# Patient Record
Sex: Male | Born: 1965 | Race: White | Hispanic: No | Marital: Married | Smoking: Never smoker
Health system: Southern US, Community
[De-identification: ages and names within clinical notes are randomized; demographics above are authoritative.]

## PROBLEM LIST (undated history)

## (undated) DIAGNOSIS — I10 Essential (primary) hypertension: Secondary | ICD-10-CM

## (undated) DIAGNOSIS — N400 Enlarged prostate without lower urinary tract symptoms: Secondary | ICD-10-CM

## (undated) DIAGNOSIS — G459 Transient cerebral ischemic attack, unspecified: Secondary | ICD-10-CM

## (undated) HISTORY — PX: BONE TUMOR EXCISION: SHX1254

## (undated) HISTORY — PX: FRACTURE SURGERY: SHX138

---

## 2006-12-23 ENCOUNTER — Emergency Department: Payer: Self-pay | Admitting: Emergency Medicine

## 2009-10-30 DIAGNOSIS — R55 Syncope and collapse: Secondary | ICD-10-CM

## 2009-10-30 HISTORY — DX: Syncope and collapse: R55

## 2010-06-14 ENCOUNTER — Emergency Department: Payer: Self-pay | Admitting: Internal Medicine

## 2010-06-16 ENCOUNTER — Ambulatory Visit: Payer: Self-pay | Admitting: Orthopedic Surgery

## 2010-10-17 ENCOUNTER — Observation Stay: Payer: Self-pay | Admitting: Internal Medicine

## 2018-04-29 ENCOUNTER — Other Ambulatory Visit: Payer: Self-pay

## 2018-04-29 ENCOUNTER — Emergency Department: Payer: BLUE CROSS/BLUE SHIELD

## 2018-04-29 ENCOUNTER — Observation Stay
Admission: EM | Admit: 2018-04-29 | Discharge: 2018-05-01 | Disposition: A | Discharge status: Home / Self Care | Payer: BLUE CROSS/BLUE SHIELD | Attending: Internal Medicine | Admitting: Internal Medicine

## 2018-04-29 ENCOUNTER — Encounter: Payer: Self-pay | Admitting: Emergency Medicine

## 2018-04-29 DIAGNOSIS — R4781 Slurred speech: Secondary | ICD-10-CM

## 2018-04-29 DIAGNOSIS — F1010 Alcohol abuse, uncomplicated: Secondary | ICD-10-CM | POA: Insufficient documentation

## 2018-04-29 DIAGNOSIS — G459 Transient cerebral ischemic attack, unspecified: Secondary | ICD-10-CM | POA: Diagnosis not present

## 2018-04-29 DIAGNOSIS — R9089 Other abnormal findings on diagnostic imaging of central nervous system: Secondary | ICD-10-CM | POA: Insufficient documentation

## 2018-04-29 DIAGNOSIS — R197 Diarrhea, unspecified: Secondary | ICD-10-CM | POA: Insufficient documentation

## 2018-04-29 DIAGNOSIS — R42 Dizziness and giddiness: Principal | ICD-10-CM | POA: Insufficient documentation

## 2018-04-29 DIAGNOSIS — Z79899 Other long term (current) drug therapy: Secondary | ICD-10-CM | POA: Diagnosis not present

## 2018-04-29 DIAGNOSIS — E860 Dehydration: Secondary | ICD-10-CM | POA: Insufficient documentation

## 2018-04-29 DIAGNOSIS — I739 Peripheral vascular disease, unspecified: Secondary | ICD-10-CM | POA: Diagnosis not present

## 2018-04-29 DIAGNOSIS — I10 Essential (primary) hypertension: Secondary | ICD-10-CM | POA: Diagnosis not present

## 2018-04-29 HISTORY — DX: Essential (primary) hypertension: I10

## 2018-04-29 LAB — BASIC METABOLIC PANEL
ANION GAP: 12 (ref 5–15)
BUN: 15 mg/dL (ref 6–20)
CO2: 26 mmol/L (ref 22–32)
Calcium: 9.4 mg/dL (ref 8.9–10.3)
Chloride: 103 mmol/L (ref 98–111)
Creatinine, Ser: 0.73 mg/dL (ref 0.61–1.24)
GFR calc Af Amer: >60 mL/min (ref >60)
GLUCOSE: 194 mg/dL — AB (ref 70–99)
POTASSIUM: 3.6 mmol/L (ref 3.5–5.1)
SODIUM: 141 mmol/L (ref 135–145)

## 2018-04-29 LAB — URINALYSIS, COMPLETE (UACMP) WITH MICROSCOPIC
BACTERIA UA: NONE SEEN
Bilirubin Urine: NEGATIVE
GLUCOSE, UA: NEGATIVE mg/dL
Hgb urine dipstick: NEGATIVE
KETONES UR: NEGATIVE mg/dL
LEUKOCYTES UA: NEGATIVE
NITRITE: NEGATIVE
PH: 5 (ref 5.0–8.0)
Protein, ur: NEGATIVE mg/dL
Specific Gravity, Urine: 1.023 (ref 1.005–1.030)
Squamous Epithelial / LPF: NONE SEEN (ref 0–5)
WBC, UA: NONE SEEN WBC/hpf (ref 0–5)

## 2018-04-29 LAB — PROTIME-INR
INR: 1
PROTHROMBIN TIME: 13.1 s (ref 11.4–15.2)

## 2018-04-29 LAB — URINE DRUG SCREEN, QUALITATIVE (ARMC ONLY)
AMPHETAMINES, UR SCREEN: NOT DETECTED
Benzodiazepine, Ur Scrn: NOT DETECTED
Cannabinoid 50 Ng, Ur ~~LOC~~: NOT DETECTED
Cocaine Metabolite,Ur ~~LOC~~: NOT DETECTED
MDMA (ECSTASY) UR SCREEN: NOT DETECTED
METHADONE SCREEN, URINE: NOT DETECTED
OPIATE, UR SCREEN: NOT DETECTED
PHENCYCLIDINE (PCP) UR S: NOT DETECTED
Tricyclic, Ur Screen: NOT DETECTED

## 2018-04-29 LAB — GLUCOSE, CAPILLARY: Glucose-Capillary: 181 mg/dL — ABNORMAL HIGH (ref 70–99)

## 2018-04-29 LAB — CBC
HEMATOCRIT: 39.2 % — AB (ref 40.0–52.0)
Hemoglobin: 14 g/dL (ref 13.0–18.0)
MCH: 33.6 pg (ref 26.0–34.0)
MCHC: 35.7 g/dL (ref 32.0–36.0)
MCV: 94 fL (ref 80.0–100.0)
Platelets: 304 10*3/uL (ref 150–440)
RBC: 4.17 MIL/uL — ABNORMAL LOW (ref 4.40–5.90)
RDW: 12.7 % (ref 11.5–14.5)
WBC: 13.4 10*3/uL — AB (ref 3.8–10.6)

## 2018-04-29 LAB — TROPONIN I

## 2018-04-29 LAB — APTT

## 2018-04-29 MED ORDER — ASPIRIN 325 MG PO TABS
325.0000 mg | ORAL_TABLET | Freq: Every day | ORAL | Status: DC
  Administered 2018-04-29 – 2018-05-01 (×3): 325 mg via ORAL
  Filled 2018-04-29 (×3): qty 1

## 2018-04-29 MED ORDER — VITAMIN B-1 100 MG PO TABS
100.0000 mg | ORAL_TABLET | Freq: Every day | ORAL | Status: DC
  Administered 2018-04-30 – 2018-05-01 (×2): 100 mg via ORAL
  Filled 2018-04-29 (×2): qty 1

## 2018-04-29 MED ORDER — ACETAMINOPHEN 650 MG RE SUPP: 650.0000 mg | RECTAL | Status: DC | PRN

## 2018-04-29 MED ORDER — LISINOPRIL 20 MG PO TABS
20.0000 mg | ORAL_TABLET | Freq: Every day | ORAL | Status: DC
  Administered 2018-04-29 – 2018-05-01 (×3): 20 mg via ORAL
  Filled 2018-04-29 (×2): qty 1
  Filled 2018-04-29: qty 2

## 2018-04-29 MED ORDER — HYDROXYZINE HCL 25 MG PO TABS
25.0000 mg | ORAL_TABLET | Freq: Four times a day (QID) | ORAL | Status: DC | PRN
  Filled 2018-04-29: qty 1

## 2018-04-29 MED ORDER — ASPIRIN 300 MG RE SUPP: 300.0000 mg | Freq: Every day | RECTAL | Status: DC

## 2018-04-29 MED ORDER — THIAMINE HCL 100 MG/ML IJ SOLN
100.0000 mg | Freq: Once | INTRAMUSCULAR | Status: AC
  Administered 2018-04-29: 23:00:00 100 mg via INTRAMUSCULAR
  Filled 2018-04-29: qty 1

## 2018-04-29 MED ORDER — SODIUM CHLORIDE 0.9 % IV BOLUS
1000.0000 mL | Freq: Once | INTRAVENOUS | Status: AC
  Administered 2018-04-29: 1000 mL via INTRAVENOUS

## 2018-04-29 MED ORDER — LABETALOL HCL 5 MG/ML IV SOLN
10.0000 mg | INTRAVENOUS | Status: DC | PRN
  Administered 2018-04-29: 21:00:00 10 mg via INTRAVENOUS
  Filled 2018-04-29: qty 4

## 2018-04-29 MED ORDER — ACETAMINOPHEN 325 MG PO TABS
650.0000 mg | ORAL_TABLET | ORAL | Status: DC | PRN
  Administered 2018-04-30 (×2): 650 mg via ORAL
  Filled 2018-04-29 (×2): qty 2

## 2018-04-29 MED ORDER — LABETALOL HCL 5 MG/ML IV SOLN: 10.0000 mg | INTRAVENOUS | Status: DC | PRN

## 2018-04-29 MED ORDER — LORAZEPAM 1 MG PO TABS: 1.0000 mg | ORAL_TABLET | Freq: Four times a day (QID) | ORAL | Status: DC | PRN

## 2018-04-29 MED ORDER — ENOXAPARIN SODIUM 40 MG/0.4ML ~~LOC~~ SOLN: 40.0000 mg | SUBCUTANEOUS | Status: DC

## 2018-04-29 MED ORDER — ASPIRIN 81 MG PO CHEW: 324.0000 mg | CHEWABLE_TABLET | Freq: Once | ORAL | Status: DC

## 2018-04-29 MED ORDER — STROKE: EARLY STAGES OF RECOVERY BOOK: Freq: Once | Status: DC

## 2018-04-29 MED ORDER — ADULT MULTIVITAMIN W/MINERALS CH
1.0000 | ORAL_TABLET | Freq: Every day | ORAL | Status: DC
  Administered 2018-04-29 – 2018-05-01 (×3): 1 via ORAL
  Filled 2018-04-29 (×3): qty 1

## 2018-04-29 MED ORDER — ONDANSETRON 4 MG PO TBDP
4.0000 mg | ORAL_TABLET | Freq: Four times a day (QID) | ORAL | Status: DC | PRN
Start: 2018-04-29 — End: 2018-05-01
  Administered 2018-04-29 – 2018-04-30 (×2): 4 mg via ORAL
  Filled 2018-04-29 (×3): qty 1

## 2018-04-29 MED ORDER — TAMSULOSIN HCL 0.4 MG PO CAPS: 0.4000 mg | ORAL_CAPSULE | Freq: Every day | ORAL | Status: DC | PRN

## 2018-04-29 MED ORDER — LOPERAMIDE HCL 2 MG PO CAPS: 2.0000 mg | ORAL_CAPSULE | ORAL | Status: DC | PRN

## 2018-04-29 MED ORDER — ACETAMINOPHEN 160 MG/5ML PO SOLN
650.0000 mg | ORAL | Status: DC | PRN
  Filled 2018-04-29: qty 20.3

## 2018-04-29 NOTE — ED Triage Notes (Signed)
Patient presents to ED via POV from work with dizziness and nausea. Patient works outside. Patient reports eating today. Patient is pale and diaphoretic. Patient denies CP but does report mild shortness of breath.

## 2018-04-29 NOTE — ED Notes (Signed)
Pt unable to urinate at this time. Pt was given specimen cup for when is able to void. Pt wheeled back out to the lobby.

## 2018-04-29 NOTE — Progress Notes (Signed)
Advanced care plan. Purpose of the Encounter: CODE STATUS Parties in Attendance:Patient Patient's Decision Capacity:Good Subjective/Patient's story: Presented for dizziness and slurred speech Objective/Medical story Will need work up for cva Goals of care determination:  Advance care directives and goals of care discussed Patient wants everything done which includes cpr and intubation if need arises. CODE STATUS: Full code Time spent discussing advanced care planning: 16 minutes

## 2018-04-29 NOTE — Code Documentation (Signed)
Pt arrives with complaints of nausea and dizziness and abd pain, states he was working outside with a [employer] company with symptoms developed, pt was initially triaged and protocoled in lobby, at 1358 first nurse brought pt a blanket when he complained of right hand numbness, 1st nurse spoke to ED and received orders, at 1412 when Dr. Roxan Hockeyobinson assessed pt Code stroke was initiated, NIHSS 0, at 1453 during neuro exam pt states numbness has resolved but his stomach still hurts, no tPA due to resolved symptoms, report off to [employer] Group[employer]ica RN

## 2018-04-29 NOTE — Progress Notes (Signed)
   04/29/18 1437  Clinical Encounter Type  Visited With Patient;Family  Visit Type Initial;ED   Chaplain paged for Code Stroke, arrived in ED to find door to room closed and 2 family members in hallway.  Introduced self to patient's wife and aunt and provided pastoral presence and emotional support while they waited in the hallway as staff was evaluating the patient.  When staff was finished and family allowed back into the room, Chaplain introduced self to patient.  Family wanted time together, so Chaplain ended the visit.  Instructed patient and family to ask for chaplain if needed.

## 2018-04-29 NOTE — H&P (Signed)
[facility] [place] [facility]   PATIENT NAME: Tony Lang    MR#:  [id number]  DATE OF BIRTH:  [birth year]  DATE OF ADMISSION:  04/29/2018  PRIMARY CARE PHYSICIAN: Barbette Reichmann, MD   REQUESTING/REFERRING PHYSICIAN:   CHIEF COMPLAINT:   Chief Complaint  Patient presents with  . Dizziness    HISTORY OF PRESENT ILLNESS: Tony Lang  is a 52 y.o. male with a known history of dizziness and slurred speech patient works for [employer] and was working in the hot sun today.Marland Kitchen He felt weak and tired and dizzy.  After coming to the emergency room he was sitting in the lobby and he had some slurred speech and weakness in the right arm was evaluated immediately in the ER with CT head, MRI and MRA brain which did not show any abnormality. Patients slurred speech improved and weakness in the right arm is also improved in the emergency room.  His blood pressure has been elevated.  Hospitalist service was consulted.  PAST MEDICAL HISTORY:   Past Medical History:  Diagnosis Date  . Hypertension     PAST SURGICAL HISTORY: History reviewed. No pertinent surgical history.  SOCIAL HISTORY:  Social History   Tobacco Use  . Smoking status: Never Smoker  . Smokeless tobacco: Never Used  Substance Use Topics  . Alcohol use: Yes    Comment: 3-4 (12oz) beers daily     FAMILY HISTORY: No family history on file.  DRUG ALLERGIES: No Known Allergies  REVIEW OF SYSTEMS:   CONSTITUTIONAL: No fever, has fatigue and weakness.  EYES: No blurred or double vision.  EARS, NOSE, AND THROAT: No tinnitus or ear pain.  RESPIRATORY: No cough, shortness of breath, wheezing or hemoptysis.  CARDIOVASCULAR: No chest pain, orthopnea, edema.  GASTROINTESTINAL: No nausea, vomiting, diarrhea or abdominal pain.  GENITOURINARY: No dysuria, hematuria.  ENDOCRINE: No polyuria, nocturia,  HEMATOLOGY: No anemia, easy bruising or bleeding SKIN: No rash or  lesion. MUSCULOSKELETAL: No joint pain or arthritis.   NEUROLOGIC: No tingling, numbness,  Had weakness right arm dizziness PSYCHIATRY: No anxiety or depression.   MEDICATIONS AT HOME:  Prior to Admission medications   Medication Sig Start Date End Date Taking? Authorizing Provider  lisinopril (PRINIVIL,ZESTRIL) 20 MG tablet Take 10-20 mg by mouth daily.   Yes [provider]  tamsulosin (FLOMAX) 0.4 MG CAPS capsule Take 0.4 mg by mouth daily as needed (prostate).   Yes [provider]  traMADol (ULTRAM) 50 MG tablet Take 25 mg by mouth daily as needed for moderate pain.   Yes [provider]      PHYSICAL EXAMINATION:   VITAL SIGNS: Blood pressure (!) 170/107, pulse 83, temperature 97.8 F (36.6 C), temperature source Oral, resp. rate 15, height 5\' 11"  (1.803 m), weight 74.8 kg (165 lb), SpO2 100 %.  GENERAL:  52 y.o.-year-old patient lying in the bed with no acute distress.  EYES: Pupils equal, round, reactive to light and accommodation. No scleral icterus. Extraocular muscles intact.  HEENT: Head atraumatic, normocephalic. Oropharynx and nasopharynx clear.  NECK:  Supple, no jugular venous distention. No thyroid enlargement, no tenderness.  LUNGS: Normal breath sounds bilaterally, no wheezing, rales,rhonchi or crepitation. No use of accessory muscles of respiration.  CARDIOVASCULAR: S1, S2 normal. No murmurs, rubs, or gallops.  ABDOMEN: Soft, nontender, nondistended. Bowel sounds present. No organomegaly or mass.  EXTREMITIES: No pedal edema, cyanosis, or clubbing.  NEUROLOGIC: Cranial nerves II through XII are intact. Muscle strength 5/5  in all extremities. Sensation intact. Gait not checked.  Had slurred speech PSYCHIATRIC: The patient is alert and oriented x 3.  SKIN: No obvious rash, lesion, or ulcer.   LABORATORY PANEL:   CBC Recent Labs  Lab 04/29/18 1304  WBC 13.4*  HGB 14.0  HCT 39.2*  PLT 304  MCV 94.0  MCH 33.6  MCHC 35.7  RDW  12.7   ------------------------------------------------------------------------------------------------------------------  Chemistries  Recent Labs  Lab 04/29/18 1304  NA 141  K 3.6  CL 103  CO2 26  GLUCOSE 194*  BUN 15  CREATININE 0.73  CALCIUM 9.4   ------------------------------------------------------------------------------------------------------------------ estimated creatinine clearance is 115.6 mL/min (by C-G formula based on SCr of 0.73 mg/dL). ------------------------------------------------------------------------------------------------------------------ No results for input(s): TSH, T4TOTAL, T3FREE, THYROIDAB in the last 72 hours.  Invalid input(s): FREET3   Coagulation profile Recent Labs  Lab 04/29/18 1427  INR 1.00   ------------------------------------------------------------------------------------------------------------------- No results for input(s): DDIMER in the last 72 hours. -------------------------------------------------------------------------------------------------------------------  Cardiac Enzymes Recent Labs  Lab 04/29/18 1427  TROPONINI <0.03   ------------------------------------------------------------------------------------------------------------------ Invalid input(s): POCBNP  ---------------------------------------------------------------------------------------------------------------  Urinalysis    Component Value Date/Time   COLORURINE YELLOW (A) 04/29/2018 1304   APPEARANCEUR CLOUDY (A) 04/29/2018 1304   LABSPEC 1.023 04/29/2018 1304   PHURINE 5.0 04/29/2018 1304   GLUCOSEU NEGATIVE 04/29/2018 1304   HGBUR NEGATIVE 04/29/2018 1304   BILIRUBINUR NEGATIVE 04/29/2018 1304   KETONESUR NEGATIVE 04/29/2018 1304   PROTEINUR NEGATIVE 04/29/2018 1304   NITRITE NEGATIVE 04/29/2018 1304   LEUKOCYTESUR NEGATIVE 04/29/2018 1304     RADIOLOGY: Ct Head Wo Contrast  Result Date: 04/29/2018 CLINICAL DATA:  Dizziness,  nausea, and right hand tingling. Slurred speech. Visual disturbance. EXAM: CT HEAD WITHOUT CONTRAST TECHNIQUE: Contiguous axial images were obtained from the base of the skull through the vertex without intravenous contrast. COMPARISON:  None. FINDINGS: Brain: There is no evidence of acute infarct, intracranial hemorrhage, mass, midline shift, or extra-axial fluid collection. The ventricles and sulci are normal. Vascular: No hyperdense vessel. Skull: No fracture or focal osseous lesion. Sinuses/Orbits: Visualized paranasal sinuses and mastoid air cells are clear. Orbits are unremarkable. Other: None. IMPRESSION: Negative head CT. Electronically Signed   By: Sebastian Ache M.D.   On: 04/29/2018 14:23   Mr Maxine Glenn Head Wo Contrast  Result Date: 04/29/2018 CLINICAL DATA:  Dizziness, nausea, and right hand numbness and tingling. Slurred speech. Symptoms have resolved. EXAM: MRI HEAD WITHOUT CONTRAST MRA HEAD WITHOUT CONTRAST TECHNIQUE: Multiplanar, multiecho pulse sequences of the brain and surrounding structures were obtained without intravenous contrast. Angiographic images of the head were obtained using MRA technique without contrast. COMPARISON:  Head CT 04/29/2018 FINDINGS: MRI HEAD FINDINGS Brain: There is no evidence of acute infarct, intracranial hemorrhage, mass, midline shift, or extra-axial fluid collection. The ventricles and sulci are normal. There are several scattered punctate foci of T2 hyperintensity in the cerebral white matter. Vascular: Major intracranial vascular flow voids are preserved. Skull and upper cervical spine: Unremarkable bone marrow signal. Sinuses/Orbits: Unremarkable orbits. Minimal mucosal thickening in the maxillary sinuses. No significant mastoid fluid. Other: None. MRA HEAD FINDINGS The visualized distal vertebral arteries are widely patent to the basilar with the right being mildly dominant. Patent right PICA and bilateral SCA origins are visualized. Left PICA and bilateral AICA is  are not identified. The basilar artery is widely patent. There is a small right posterior communicating artery. The PCAs are patent without evidence of significant stenosis. The internal carotid arteries are widely patent from skull base to carotid  termini. The ACAs and MCAs are patent without evidence of proximal branch occlusion or significant stenosis. No aneurysm is identified. IMPRESSION: 1. No acute intracranial abnormality. 2. Minimal cerebral white matter T2 signal changes, nonspecific though may reflect early chronic small vessel ischemia, migraines, or prior infection/inflammation. 3. Negative head MRA. Electronically Signed   By: Sebastian AcheAllen  Grady M.D.   On: 04/29/2018 17:03   Mr Brain Wo Contrast  Result Date: 04/29/2018 CLINICAL DATA:  Dizziness, nausea, and right hand numbness and tingling. Slurred speech. Symptoms have resolved. EXAM: MRI HEAD WITHOUT CONTRAST MRA HEAD WITHOUT CONTRAST TECHNIQUE: Multiplanar, multiecho pulse sequences of the brain and surrounding structures were obtained without intravenous contrast. Angiographic images of the head were obtained using MRA technique without contrast. COMPARISON:  Head CT 04/29/2018 FINDINGS: MRI HEAD FINDINGS Brain: There is no evidence of acute infarct, intracranial hemorrhage, mass, midline shift, or extra-axial fluid collection. The ventricles and sulci are normal. There are several scattered punctate foci of T2 hyperintensity in the cerebral white matter. Vascular: Major intracranial vascular flow voids are preserved. Skull and upper cervical spine: Unremarkable bone marrow signal. Sinuses/Orbits: Unremarkable orbits. Minimal mucosal thickening in the maxillary sinuses. No significant mastoid fluid. Other: None. MRA HEAD FINDINGS The visualized distal vertebral arteries are widely patent to the basilar with the right being mildly dominant. Patent right PICA and bilateral SCA origins are visualized. Left PICA and bilateral AICA is are not identified.  The basilar artery is widely patent. There is a small right posterior communicating artery. The PCAs are patent without evidence of significant stenosis. The internal carotid arteries are widely patent from skull base to carotid termini. The ACAs and MCAs are patent without evidence of proximal branch occlusion or significant stenosis. No aneurysm is identified. IMPRESSION: 1. No acute intracranial abnormality. 2. Minimal cerebral white matter T2 signal changes, nonspecific though may reflect early chronic small vessel ischemia, migraines, or prior infection/inflammation. 3. Negative head MRA. Electronically Signed   By: Sebastian AcheAllen  Grady M.D.   On: 04/29/2018 17:03    EKG: Orders placed or performed during the hospital encounter of 04/29/18  . EKG 12-Lead  . EKG 12-Lead  . ED EKG  . ED EKG    IMPRESSION AND PLAN:  52 year old male patient with history of hyper pressure presented to the emergency room with dizziness, weakness and slurred speech.  Symptoms have improved  -Transient ischemic attack  Start patient on oral aspirin Neurology consultation Check carotid ultrasound and echocardiogram  -Uncontrolled hypertension Control blood pressure  -Dizziness and dehydration IV fluids  -DVT prophylaxis subcu Lovenox daily  All the records are reviewed and case discussed with ED provider. Management plans discussed with the patient, family and they are in agreement.  CODE STATUS:Full code    TOTAL TIME TAKING CARE OF THIS PATIENT: 52 minutes.    Ihor AustinPavan Yaseen Gilberg M.D on 04/29/2018 at 6:24 PM  Between 7am to 6pm - Pager - [phone]  After 6pm go to [email or web address] - password [email or web address] [facility]  [facility]  Office  [phone]  CC: Primary care physician; Barbette ReichmannHande, Vishwanath, MD

## 2018-04-29 NOTE — ED Notes (Addendum)
Pt given a blanket by staff. Family member up to first nurse desk and is concerned because patient "does not feel normal". Pt still in wheelchair.  This RN over to patient in lobby. Pt now c/o tingling to right hand only. Grip strength equal, but weak bilaterally. Facial symmetry with smiling and at rest. Family reports slurred speech and patient is reporting seeing "fragments". Pt alert and oriented X4. VSS reassessed. Will notify EDP for further orders.  Sx discovered by this RN at 1355.

## 2018-04-29 NOTE — Consult Note (Signed)
[facility] Tele[facility]rology Consult Services   DATE: April 29, 2018 Impression: Transient right-sided numbness-the patient isn't really sure which is affected his entire right arm or his entire right side. Also report of slurred speech.  Currently he is asymptomatic.  For this reason he is not a TPA candidate  He said he never had these symptoms before.   Certainly since his CT of the head does not show any acute changes can administer aspirin and admitted for inpatient neurology consultation and workup.  Not a tpa candidate due to: NIH of 0 resolution of symptoms  Symptoms (not) consistent with LVO therefore no role for NIR  Differential Diagnosis: TIA, CVA 1. Cardioembolic stroke 2. Small vessel disease/lacune 3. Thromboembolic, artery-to-artery mechanism 4. Hypercoagulable state-related infarct 5. Transient ischemic attack 6. Thrombotic mechanism, large artery disease  Comments: Last known normal noon Door time: 12:38 [facility] contacted: 14:44 [facility] at bedside: 14:47 NIHSS assessment time: 14:47  Recommendations: -Start aspirin Inpatient neurology consultation Inpatient stroke evaluation as per Neurology/ Internal Medicine Discussed with ED MD Please call with questions ----------------------------------------------------------------------------------------- CC right-sided numbness  History of Present Illness  Patient is a 52 year old gentleman with no real past medical history aside from hypertension when she presented to the hospital with nausea and abdominal pain.  He said that several hours prior to presentation he developed light headedness along with nausea and dry heaves.  He noticed that his right hand was numb and tingling some time around presentation to the emergency department.  He thinks his right arm and hand were involved he is really not sure if the face or leg were involved.  Family said his speech was slurred at some point.  No other focal  weakness numbness or tingling.  No history of stroke.  Does not take any antiplatelets or blood thinners at home. Symptoms have resolved.  Also had sense of dizziness and light headedness.  report of slurred speech also Diagnostic: CTH w/o no acute changes  Exam: 1a- LOC: Keenly responsive - =0     1b- LOC questions: Answers both questions correctly - 0     1c- LOC commands- Performs both tasks correctly- 0     2- Gaze: Normal; no gaze paresis or gaze deviation - 0     3- Visual Fields: normal, no Visual field deficit - 0     4- Facial movements: no facial palsy - 0     5- Upper limb motor - no drift -0     6- Lower limb motor - no drift - 0      7- Limb Coordination: absent ataxia - 0      8- Sensory : no sensory loss - 0      9- Language - No aphasia - 0      10- Speech - No dysarthria -0     11- Neglect / Extinction - none found -0     NIHSS score  =0   Medical Decision Making: - Extensive number of diagnosis or management options are considered above. - Extensive amount of complex data reviewed. - High risk of complication and/or morbidity or mortality are associated with differential diagnostic considerations above. - There may be Uncertain outcome and increased probability of prolonged functional impairment or high probability of severe prolonged functional impairment associated with some of these differential diagnosis. Medical Data Reviewed: 1.Data reviewed include clinical labs, radiology, Medical Tests; 2.Tests results discussed w/performing or interpreting physician; 3.Obtaining/reviewing old medical records; 4.Obtaining case history from another source; 5.Independent review  of image, tracing or specimen. Patient was informed the Neurology Consult would happen via telehealth (remote video) and consented to receiving care in this manner.

## 2018-04-29 NOTE — ED Notes (Signed)
Pt to MRI

## 2018-04-29 NOTE — ED Provider Notes (Signed)
[facility] Emergency Department Provider Note    First MD Initiated Contact with Patient 04/29/18 1412     (approximate)  I have reviewed the triage vital signs and the nursing notes.   HISTORY  Chief Complaint Dizziness    HPI KYCE GING is a 52 y.o. male presents the ER with chief complaint of dizziness and lightheadedness.  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  While and ER waiting room started developing slurred speech and right-sided weakness and tingling around 155.  At that point patient brought back emergently to the ER bed.  Denies any history of stroke.  He does smoke.  Does have a history of hypertension.  Denies any headache.    Past Medical History:  Diagnosis Date  . Hypertension    No family history on file. History reviewed. No pertinent surgical history. There are no active problems to display for this patient.     Prior to Admission medications   Not on File    Allergies Patient has no known allergies.    Social History Social History   Tobacco Use  . Smoking status: Never Smoker  . Smokeless tobacco: Never Used  Substance Use Topics  . Alcohol use: Yes    Comment: 3-4 (12oz) beers daily   . Drug use: Never    Review of Systems Patient denies headaches, rhinorrhea, blurry vision, numbness, shortness of breath, chest pain, edema, cough, abdominal pain, nausea, vomiting, diarrhea, dysuria, fevers, rashes or hallucinations unless otherwise stated above in HPI. ____________________________________________   PHYSICAL EXAM:  VITAL SIGNS: Vitals:   04/29/18 1249 04/29/18 1404  BP: (!) 142/107 (!) 169/101  Pulse: 75 80  Resp: 17 16  Temp: 97.8 F (36.6 C)   SpO2: 100% 100%    Constitutional: Alert and oriented.  Eyes: Conjunctivae are normal.  Head: Atraumatic. Nose: No  congestion/rhinnorhea. Mouth/Throat: Mucous membranes are moist.   Neck: No stridor. Painless ROM.  Cardiovascular: Normal rate, regular rhythm. Grossly normal heart sounds.  Good peripheral circulation. Respiratory: Normal respiratory effort.  No retractions. Lungs CTAB. Gastrointestinal: Soft and nontender. No distention. No abdominal bruits. No CVA tenderness. Genitourinary:  Musculoskeletal: No lower extremity tenderness nor edema.  No joint effusions. Neurologic:  Normal speech and language. Decreased sensation to RUE,  Trace right facial droop, no drift, normal FNF Skin:  Skin is warm, dry and intact. No rash noted. Psychiatric: Mood and affect are normal. Speech and behavior are normal.  ____________________________________________   LABS (all labs ordered are listed, but only abnormal results are displayed)  Results for orders placed or performed during the hospital encounter of 04/29/18 (from the past 24 hour(s))  Basic metabolic panel     Status: Abnormal   Collection Time: 04/29/18  1:04 PM  Result Value Ref Range   Sodium 141 135 - 145 mmol/L   Potassium 3.6 3.5 - 5.1 mmol/L   Chloride 103 98 - 111 mmol/L   CO2 26 22 - 32 mmol/L   Glucose, Bld 194 (H) 70 - 99 mg/dL   BUN 15 6 - 20 mg/dL   Creatinine, Ser 6.21 0.61 - 1.24 mg/dL   Calcium 9.4 8.9 - 30.8 mg/dL   GFR calc non Af Amer >60 >60 mL/min   GFR calc Af Amer >60 >60 mL/min   Anion gap 12 5 - 15  CBC     Status: Abnormal  Collection Time: 04/29/18  1:04 PM  Result Value Ref Range   WBC 13.4 (H) 3.8 - 10.6 K/uL   RBC 4.17 (L) 4.40 - 5.90 MIL/uL   Hemoglobin 14.0 13.0 - 18.0 g/dL   HCT 81.139.2 (L) 91.440.0 - 78.252.0 %   MCV 94.0 80.0 - 100.0 fL   MCH 33.6 26.0 - 34.0 pg   MCHC 35.7 32.0 - 36.0 g/dL   RDW 95.612.7 21.311.5 - 08.614.5 %   Platelets 304 150 - 440 K/uL  Urinalysis, Complete w Microscopic     Status: Abnormal   Collection Time: 04/29/18  1:04 PM  Result Value Ref Range   Color, Urine YELLOW (A) YELLOW   APPearance  CLOUDY (A) CLEAR   Specific Gravity, Urine 1.023 1.005 - 1.030   pH 5.0 5.0 - 8.0   Glucose, UA NEGATIVE NEGATIVE mg/dL   Hgb urine dipstick NEGATIVE NEGATIVE   Bilirubin Urine NEGATIVE NEGATIVE   Ketones, ur NEGATIVE NEGATIVE mg/dL   Protein, ur NEGATIVE NEGATIVE mg/dL   Nitrite NEGATIVE NEGATIVE   Leukocytes, UA NEGATIVE NEGATIVE   RBC / HPF 0-5 0 - 5 RBC/hpf   WBC, UA NONE SEEN 0 - 5 WBC/hpf   Bacteria, UA NONE SEEN NONE SEEN   Squamous Epithelial / LPF NONE SEEN 0 - 5   Mucus PRESENT    Amorphous Crystal PRESENT    Ca Oxalate Crys, UA PRESENT   Glucose, capillary     Status: Abnormal   Collection Time: 04/29/18  1:09 PM  Result Value Ref Range   Glucose-Capillary 181 (H) 70 - 99 mg/dL   Comment 1 Notify RN    Comment 2 Document in Chart   Protime-INR     Status: None   Collection Time: 04/29/18  2:27 PM  Result Value Ref Range   Prothrombin Time 13.1 11.4 - 15.2 seconds   INR 1.00   APTT     Status: Abnormal   Collection Time: 04/29/18  2:27 PM  Result Value Ref Range   aPTT <24 (L) 24 - 36 seconds  Troponin I     Status: None   Collection Time: 04/29/18  2:27 PM  Result Value Ref Range   Troponin I <0.03 <0.03 ng/mL   ____________________________________________  EKG My review and personal interpretation at Time: 12:47   Indication: dizziness  Rate: 75  Rhythm: sinus Axis: normal Other: normal intervals, no stemi ____________________________________________  RADIOLOGY  I personally reviewed all radiographic images ordered to evaluate for the above acute complaints and reviewed radiology reports and findings.  These findings were personally discussed with the patient.  Please see medical record for radiology report.  ____________________________________________   PROCEDURES  Procedure(s) performed:  Procedures    Critical Care performed: no ____________________________________________   INITIAL IMPRESSION / ASSESSMENT AND PLAN / ED  COURSE  Pertinent labs & imaging results that were available during my care of the patient were reviewed by me and considered in my medical decision making (see chart for details).   DDX: cva, tia, hypoglycemia, dehydration, electrolyte abnormality, heat exhaustion, dissection, sepsis   Josefine ClassJohnny R Ogden is a 52 y.o. who presents to the ED with symptoms as described above.  Patient was taking his airway.  Is complaining of right-sided deficit and is within the TPA window.  NIH maximum of 1 with no objective deficits at this time.  Code stroke called based on the new onset of his symptoms.  Have high suspicion for some component of heat illness.  Blood  work sent for the above differential.  Clinical Course as of Apr 30 1747  Mon Apr 29, 2018  1537 Patient symptoms have resolved at time of being evaluated by neurology.  Currently seems less clinically consistent with TIA more likely heat exhaustion will continue with IV fluids check blood work in order MRI to further evaluate for CVA.   [PR]  1742 MRI does show chronic microvascular changes.  Remains hemodynamically stable patient states that he feels weak still.  Based on his presentation after discussion with neurology patient will be observed in the hospital for serial neuro exams to medical work-up.   [PR]    Clinical Course User Index [PR] Willy Eddy, MD     As part of my medical decision making, I reviewed the following data within the electronic MEDICAL RECORD NUMBER Nursing notes reviewed and incorporated, Labs reviewed, notes from prior ED visits.   ____________________________________________   FINAL CLINICAL IMPRESSION(S) / ED DIAGNOSES  Final diagnoses:  Dizziness  Slurred speech      NEW MEDICATIONS STARTED DURING THIS VISIT:  New Prescriptions   No medications on file     Note:  This document was prepared using Dragon voice recognition software and may include unintentional dictation errors.    Willy Eddy, MD 04/29/18 [phone]

## 2018-04-29 NOTE — ED Notes (Addendum)
Spoke with Dr. Roxan Hockeyobinson, orders received.

## 2018-04-29 NOTE — ED Notes (Signed)
Report to Drew, RN

## 2018-04-29 NOTE — ED Notes (Addendum)
FIRST NURSE NOTE:  Pt in wheelchair with emesis bag. Pt alert and oriented X 4. Dry heaving.

## 2018-04-29 NOTE — Progress Notes (Signed)
CODE STROKE- PHARMACY COMMUNICATION   Time CODE STROKE called/page received:1436  Time response to CODE STROKE was made (in person or via phone): 1439 in person  Time Stroke Kit retrieved from [place] (only if needed):n/a  Name of Provider/Nurse contacted:   Past Medical History:  Diagnosis Date  . Hypertension    Prior to Admission medications   Medication Sig Start Date End Date Taking? Authorizing Provider  lisinopril (PRINIVIL,ZESTRIL) 20 MG tablet Take 10-20 mg by mouth daily.   Yes [provider]  tamsulosin (FLOMAX) 0.4 MG CAPS capsule Take 0.4 mg by mouth daily as needed (prostate).   Yes [provider]  traMADol (ULTRAM) 50 MG tablet Take 25 mg by mouth daily as needed for moderate pain.   Yes [provider]    Noralee Space ,PharmD Clinical Pharmacist  04/29/2018  3:27 PM

## 2018-04-29 NOTE — ED Notes (Signed)
Pt returned from MRI °

## 2018-04-29 NOTE — ED Notes (Addendum)
Symptoms discovered by RN at 1355. Patient taken to CT by this RN. Airway cleared by Dr. Roxan Hockeyobinson at [phone]. At CT by 1414 and patient in room at 1420. Shanda BumpsJessica, RN in room with patient.

## 2018-04-30 LAB — GASTROINTESTINAL PANEL BY PCR, STOOL (REPLACES STOOL CULTURE)

## 2018-04-30 LAB — LIPASE, BLOOD: LIPASE: 56 U/L — AB (ref 11–51)

## 2018-04-30 LAB — HEPATIC FUNCTION PANEL
ALK PHOS: 75 U/L (ref 38–126)
ALT: 14 U/L (ref 0–44)
AST: 35 U/L (ref 15–41)
Albumin: 4.1 g/dL (ref 3.5–5.0)
BILIRUBIN TOTAL: 0.9 mg/dL (ref 0.3–1.2)
Bilirubin, Direct: 0.2 mg/dL (ref 0.0–0.2)
Indirect Bilirubin: 0.7 mg/dL (ref 0.3–0.9)
Total Protein: 7.1 g/dL (ref 6.5–8.1)

## 2018-04-30 LAB — LIPID PANEL
Cholesterol: 188 mg/dL (ref 0–200)
HDL: 74 mg/dL (ref >40)
LDL Cholesterol: 96 mg/dL (ref 0–99)
Total CHOL/HDL Ratio: 2.5 RATIO
Triglycerides: 92 mg/dL (ref <150)
VLDL: 18 mg/dL (ref 0–40)

## 2018-04-30 MED ORDER — HYDRALAZINE HCL 50 MG PO TABS: 25.0000 mg | ORAL_TABLET | Freq: Three times a day (TID) | ORAL | Status: DC | PRN

## 2018-04-30 MED ORDER — ATENOLOL 50 MG PO TABS
50.0000 mg | ORAL_TABLET | Freq: Every day | ORAL | Status: DC
  Administered 2018-04-30: 17:00:00 50 mg via ORAL
  Filled 2018-04-30 (×2): qty 1

## 2018-04-30 MED ORDER — SODIUM CHLORIDE 0.9 % IV SOLN
INTRAVENOUS | Status: DC
  Administered 2018-04-30 (×2): via INTRAVENOUS

## 2018-04-30 NOTE — Evaluation (Signed)
Physical Therapy Evaluation Patient Details Name: Tony Lang MRN: [id number] DOB: 02-20-1966 Today's Date: 04/30/2018   History of Present Illness  Pt is a 52 y.o. male presenting to hospital 04/29/18 with dizziness, lightheadedness, and weakness; pt with slurred speech and R sided weakness in ED.  Pt admitted for TIA, uncontrolled htn, dizziness, and dehydration.  PMH includes htn.  Clinical Impression  Pt independent with bed mobility, transfers, and ambulation within room.  Pt steady ambulating in hallway but when pt cued to look R pt suddenly veered to L but pt able to maintain balance; pt veered mildly to R initially when looking L.  Practiced looking R/L with ambulation and balance significantly improved with this repetition/practice and then pt able to maintain straight path with head turns.  No loss of balance requiring assist to correct noted during session.  Pt scored 21/24 on Dynamic gait index indicating pt is at minimal to no risk for falls.  Will discharge pt from PT in house and complete current PT order.  Please re-consult PT if pt's status changes and acute PT needs are identified.    Follow Up Recommendations No PT follow up    Equipment Recommendations  None recommended by PT    Recommendations for Other Services       Precautions / Restrictions Precautions Precautions: None(Fall risk score of 4) Restrictions Weight Bearing Restrictions: No      Mobility  Bed Mobility Overal bed mobility: Independent             General bed mobility comments: No difficulties noted  Transfers Overall transfer level: Independent Equipment used: None             General transfer comment: steady strong transfers  Ambulation/Gait Ambulation/Gait assistance: Independent Gait Distance (Feet): 300 Feet Assistive device: None Gait Pattern/deviations: Step-through pattern   Gait velocity interpretation: <1.31 ft/sec, indicative of household ambulator     Stairs Stairs: Yes Stairs assistance: Modified independent (Device/Increase time) Stair Management: One rail Right;Alternating pattern;Forwards Number of Stairs: 6 General stair comments: steady safe stairs navigation  Wheelchair Mobility    Modified Rankin (Stroke Patients Only)       Balance Overall balance assessment: Needs assistance Sitting-balance support: No upper extremity supported;Feet supported Sitting balance-Leahy Scale: Normal Sitting balance - Comments: steady sitting reaching outside BOS   Standing balance support: During functional activity Standing balance-Leahy Scale: Good Standing balance comment: when looking R initially (while ambulating) pt veered suddenly to L but able to maintain balance; also minor veer to R when looking L (when ambulating); with repetition pt able to maintain straight path without loss of balance                 Standardized Balance Assessment Standardized Balance Assessment : Dynamic Gait Index   Dynamic Gait Index Level Surface: Normal Change in Gait Speed: Normal Gait with Horizontal Head Turns: Moderate Impairment Gait with Vertical Head Turns: Normal Gait and Pivot Turn: Normal Step Over Obstacle: Normal Step Around Obstacles: Normal Steps: Mild Impairment Total Score: 21       Pertinent Vitals/Pain Pain Assessment: 0-10 Pain Score: 2  Pain Location: abdominal Pain Descriptors / Indicators: Aching Pain Intervention(s): Limited activity within patient's tolerance;Monitored during session;Repositioned  Vitals stable and WFL throughout treatment session.    Home Living Family/patient expects to be discharged to:: Private residence Living Arrangements: Spouse/significant other Available Help at Discharge: Family Type of Home: House Home Access: Stairs to enter Entrance Stairs-Rails: Right;Left;Can reach both Secretary/administrator  of Steps: 5 Home Layout: Multi-level;Able to live on main level with  bedroom/bathroom Home Equipment: None      Prior Function Level of Independence: Independent         Comments: Pt working for [employer] care [employer].  Active.  No h/o falls.     Hand Dominance        Extremity/Trunk Assessment   Upper Extremity Assessment Upper Extremity Assessment: Overall WFL for tasks assessed(intact B UE coordination (finger to nose), strength, sensation, and tone; no pronator drift)    Lower Extremity Assessment Lower Extremity Assessment: (Intact B LE sensation, tone, coordination (heel to shin), proprioception, and strength (5/5 hip flexion, knee flexion/extension, and DF))    Cervical / Trunk Assessment Cervical / Trunk Assessment: Normal  Communication   Communication: No difficulties  Cognition Arousal/Alertness: Awake/alert Behavior During Therapy: WFL for tasks assessed/performed Overall Cognitive Status: Within Functional Limits for tasks assessed                                        General Comments   Nursing cleared pt for participation in physical therapy.  Pt agreeable to PT session.    Exercises     Assessment/Plan    PT Assessment Patent does not need any further PT services  PT Problem List         PT Treatment Interventions      PT Goals (Current goals can be found in the Care Plan section)  Acute Rehab PT Goals Patient Stated Goal: to go home PT Goal Formulation: With patient Time For Goal Achievement: 05/14/18 Potential to Achieve Goals: Good    Frequency     Barriers to discharge        Co-evaluation               AM-PAC PT "6 Clicks" Daily Activity  Outcome Measure Difficulty turning over in bed (including adjusting bedclothes, sheets and blankets)?: None Difficulty moving from lying on back to sitting on the side of the bed? : None Difficulty sitting down on and standing up from a chair with arms (e.g., wheelchair, bedside commode, etc,.)?: None Help needed moving to and from a bed to  chair (including a wheelchair)?: None Help needed walking in hospital room?: None Help needed climbing 3-5 steps with a railing? : None 6 Click Score: 24    End of Session Equipment Utilized During Treatment: Gait belt Activity Tolerance: Patient tolerated treatment well Patient left: in bed;with call bell/phone within reach Nurse Communication: Mobility status;Precautions PT Visit Diagnosis: Difficulty in walking, not elsewhere classified (R26.2)    Time: 1120-1140 PT Time Calculation (min) (ACUTE ONLY): 20 min   Charges:   PT Evaluation $PT Eval Low Complexity: 1 Low     PT G CodesHendricks Limes:       Jasiel Belisle, PT 04/30/18, 1:48 PM [phone]

## 2018-04-30 NOTE — Progress Notes (Signed)
[facility] - [place] at [facility]                                                                                                                                                                                  Patient Demographics   Tony HardingJohnny Lang, is a 52 y.o. male, DOB - 1966/10/30, NWG:[id number]  Admit date - 04/29/2018   Admitting Physician Ihor AustinPavan Pyreddy, MD  Outpatient Primary MD for the patient is Barbette ReichmannHande, Vishwanath, MD   LOS - 0  Subjective: Patient admitted with TIA like symptoms nausea resolved Now he is complaining of diarrhea and nausea   Review of Systems:   CONSTITUTIONAL: No documented fever. No fatigue, weakness. No weight gain, no weight loss.  EYES: No blurry or double vision.  ENT: No tinnitus. No postnasal drip. No redness of the oropharynx.  RESPIRATORY: No cough, no wheeze, no hemoptysis. No dyspnea.  CARDIOVASCULAR: No chest pain. No orthopnea. No palpitations. No syncope.  GASTROINTESTINAL: +nausea, no vomiting or + diarrhea. No abdominal pain. No melena or hematochezia.  GENITOURINARY: No dysuria or hematuria.  ENDOCRINE: No polyuria or nocturia. No heat or cold intolerance.  HEMATOLOGY: No anemia. No bruising. No bleeding.  INTEGUMENTARY: No rashes. No lesions.  MUSCULOSKELETAL: No arthritis. No swelling. No gout.  NEUROLOGIC: No numbness, tingling, or ataxia. No seizure-type activity.  PSYCHIATRIC: No anxiety. No insomnia. No ADD.    Vitals:   Vitals:   04/30/18 0530 04/30/18 0828 04/30/18 1253 04/30/18 1321  BP: (!) 157/98 (!) 149/93 (!) 162/99 (!) 153/89  Pulse: 98 76 70 70  Resp:    20  Temp: 98.3 F (36.8 C)   98.8 F (37.1 C)  TempSrc: Oral   Oral  SpO2: 99%   100%  Weight:      Height:        Wt Readings from Last 3 Encounters:  04/29/18 76.6 kg (168 lb 12.8 oz)     Intake/Output Summary (Last 24 hours) at 04/30/2018 1601 Last data filed at 04/30/2018 1023 Gross per 24 hour  Intake 240 ml  Output -  Net 240 ml     Physical Exam:   GENERAL: Pleasant-appearing in no apparent distress.  HEAD, EYES, EARS, NOSE AND THROAT: Atraumatic, normocephalic. Extraocular muscles are intact. Pupils equal and reactive to light. Sclerae anicteric. No conjunctival injection. No oro-pharyngeal erythema.  NECK: Supple. There is no jugular venous distention. No bruits, no lymphadenopathy, no thyromegaly.  HEART: Regular rate and rhythm,. No murmurs, no rubs, no clicks.  LUNGS: Clear to auscultation bilaterally. No rales or rhonchi. No wheezes.  ABDOMEN: Soft, flat, nontender, nondistended. Has good bowel sounds. No hepatosplenomegaly appreciated.  EXTREMITIES: No evidence  of any cyanosis, clubbing, or peripheral edema.  +2 pedal and radial pulses bilaterally.  NEUROLOGIC: The patient is alert, awake, and oriented x3 with no focal motor or sensory deficits appreciated bilaterally.  SKIN: Moist and warm with no rashes appreciated.  Psych: Not anxious, depressed LN: No inguinal LN enlargement    Antibiotics   Anti-infectives (From admission, onward)   None      Medications   Scheduled Meds: . aspirin  300 mg Rectal Daily   Or  . aspirin  325 mg Oral Daily  . lisinopril  20 mg Oral Daily  . multivitamin with minerals  1 tablet Oral Daily  . thiamine  100 mg Oral Daily   Continuous Infusions: PRN Meds:.acetaminophen **OR** acetaminophen (TYLENOL) oral liquid 160 mg/5 mL **OR** acetaminophen, hydrALAZINE, hydrOXYzine, labetalol, loperamide, LORazepam, ondansetron, tamsulosin   Data Review:   Micro Results No results found for this or any previous visit (from the past 240 hour(s)).  Radiology Reports Ct Head Wo Contrast  Result Date: 04/29/2018 CLINICAL DATA:  Dizziness, nausea, and right hand tingling. Slurred speech. Visual disturbance. EXAM: CT HEAD WITHOUT CONTRAST TECHNIQUE: Contiguous axial images were obtained from the base of the skull through the vertex without intravenous contrast. COMPARISON:   None. FINDINGS: Brain: There is no evidence of acute infarct, intracranial hemorrhage, mass, midline shift, or extra-axial fluid collection. The ventricles and sulci are normal. Vascular: No hyperdense vessel. Skull: No fracture or focal osseous lesion. Sinuses/Orbits: Visualized paranasal sinuses and mastoid air cells are clear. Orbits are unremarkable. Other: None. IMPRESSION: Negative head CT. Electronically Signed   By: Sebastian Ache M.D.   On: 04/29/2018 14:23   Mr Maxine Glenn Head Wo Contrast  Result Date: 04/29/2018 CLINICAL DATA:  Dizziness, nausea, and right hand numbness and tingling. Slurred speech. Symptoms have resolved. EXAM: MRI HEAD WITHOUT CONTRAST MRA HEAD WITHOUT CONTRAST TECHNIQUE: Multiplanar, multiecho pulse sequences of the brain and surrounding structures were obtained without intravenous contrast. Angiographic images of the head were obtained using MRA technique without contrast. COMPARISON:  Head CT 04/29/2018 FINDINGS: MRI HEAD FINDINGS Brain: There is no evidence of acute infarct, intracranial hemorrhage, mass, midline shift, or extra-axial fluid collection. The ventricles and sulci are normal. There are several scattered punctate foci of T2 hyperintensity in the cerebral white matter. Vascular: Major intracranial vascular flow voids are preserved. Skull and upper cervical spine: Unremarkable bone marrow signal. Sinuses/Orbits: Unremarkable orbits. Minimal mucosal thickening in the maxillary sinuses. No significant mastoid fluid. Other: None. MRA HEAD FINDINGS The visualized distal vertebral arteries are widely patent to the basilar with the right being mildly dominant. Patent right PICA and bilateral SCA origins are visualized. Left PICA and bilateral AICA is are not identified. The basilar artery is widely patent. There is a small right posterior communicating artery. The PCAs are patent without evidence of significant stenosis. The internal carotid arteries are widely patent from skull base  to carotid termini. The ACAs and MCAs are patent without evidence of proximal branch occlusion or significant stenosis. No aneurysm is identified. IMPRESSION: 1. No acute intracranial abnormality. 2. Minimal cerebral white matter T2 signal changes, nonspecific though may reflect early chronic small vessel ischemia, migraines, or prior infection/inflammation. 3. Negative head MRA. Electronically Signed   By: Sebastian Ache M.D.   On: 04/29/2018 17:03   Mr Brain Wo Contrast  Result Date: 04/29/2018 CLINICAL DATA:  Dizziness, nausea, and right hand numbness and tingling. Slurred speech. Symptoms have resolved. EXAM: MRI HEAD WITHOUT CONTRAST MRA HEAD WITHOUT CONTRAST  TECHNIQUE: Multiplanar, multiecho pulse sequences of the brain and surrounding structures were obtained without intravenous contrast. Angiographic images of the head were obtained using MRA technique without contrast. COMPARISON:  Head CT 04/29/2018 FINDINGS: MRI HEAD FINDINGS Brain: There is no evidence of acute infarct, intracranial hemorrhage, mass, midline shift, or extra-axial fluid collection. The ventricles and sulci are normal. There are several scattered punctate foci of T2 hyperintensity in the cerebral white matter. Vascular: Major intracranial vascular flow voids are preserved. Skull and upper cervical spine: Unremarkable bone marrow signal. Sinuses/Orbits: Unremarkable orbits. Minimal mucosal thickening in the maxillary sinuses. No significant mastoid fluid. Other: None. MRA HEAD FINDINGS The visualized distal vertebral arteries are widely patent to the basilar with the right being mildly dominant. Patent right PICA and bilateral SCA origins are visualized. Left PICA and bilateral AICA is are not identified. The basilar artery is widely patent. There is a small right posterior communicating artery. The PCAs are patent without evidence of significant stenosis. The internal carotid arteries are widely patent from skull base to carotid termini.  The ACAs and MCAs are patent without evidence of proximal branch occlusion or significant stenosis. No aneurysm is identified. IMPRESSION: 1. No acute intracranial abnormality. 2. Minimal cerebral white matter T2 signal changes, nonspecific though may reflect early chronic small vessel ischemia, migraines, or prior infection/inflammation. 3. Negative head MRA. Electronically Signed   By: Sebastian Ache M.D.   On: 04/29/2018 17:03     CBC Recent Labs  Lab 04/29/18 1304  WBC 13.4*  HGB 14.0  HCT 39.2*  PLT 304  MCV 94.0  MCH 33.6  MCHC 35.7  RDW 12.7    Chemistries  Recent Labs  Lab 04/29/18 1304 04/30/18 1405  NA 141  --   K 3.6  --   CL 103  --   CO2 26  --   GLUCOSE 194*  --   BUN 15  --   CREATININE 0.73  --   CALCIUM 9.4  --   AST  --  35  ALT  --  14  ALKPHOS  --  75  BILITOT  --  0.9   ------------------------------------------------------------------------------------------------------------------ estimated creatinine clearance is 116.3 mL/min (by C-G formula based on SCr of 0.73 mg/dL). ------------------------------------------------------------------------------------------------------------------ No results for input(s): HGBA1C in the last 72 hours. ------------------------------------------------------------------------------------------------------------------ Recent Labs    04/30/18 0620  CHOL 188  HDL 74  LDLCALC 96  TRIG 92  CHOLHDL 2.5   ------------------------------------------------------------------------------------------------------------------ No results for input(s): TSH, T4TOTAL, T3FREE, THYROIDAB in the last 72 hours.  Invalid input(s): FREET3 ------------------------------------------------------------------------------------------------------------------ No results for input(s): VITAMINB12, FOLATE, FERRITIN, TIBC, IRON, RETICCTPCT in the last 72 hours.  Coagulation profile Recent Labs  Lab 04/29/18 1427  INR 1.00    No results  for input(s): DDIMER in the last 72 hours.  Cardiac Enzymes Recent Labs  Lab 04/29/18 1427  TROPONINI <0.03   ------------------------------------------------------------------------------------------------------------------ Invalid input(s): POCBNP    Assessment & Plan   52 year old male patient with history of hyper pressure presented to the emergency room with dizziness, weakness and slurred speech.  Symptoms have improved  -Transient ischemic attack  MRI shows small vessel disease no stroke Continue aspirin  -Diarrhea we will check stool comprehensive study Imodium as needed Supportive care   -Accelerated hypertension continue lisinopril and add atenolol   -Dizziness and dehydration IV fluids  -Alcohol abuse CIWA protocol      Code Status Orders  (From admission, onward)        Start  Ordered   04/29/18 2109  Full code  Continuous     04/29/18 2108    Code Status History    This patient has a current code status but no historical code status.           Consults  none  DVT Prophylaxis  Lovenox   Lab Results  Component Value Date   PLT 304 04/29/2018     Time Spent in minutes   Greater than 50% of time spent in care coordination and counseling patient regarding the condition and plan of care.   Auburn Bilberry M.D on 04/30/2018 at 4:01 PM  Between 7am to 6pm - Pager - [phone]  After 6pm go to [email or web address] - Social research officer, government  [facility]   Office  [phone]

## 2018-04-30 NOTE — Progress Notes (Signed)
Report given by off-coming nurse that pt passed swallow screen. Kinnie ScalesKim Duru Reiger, LPN

## 2018-04-30 NOTE — Plan of Care (Signed)

## 2018-04-30 NOTE — Progress Notes (Signed)
Spoke with Dr Anne HahnWillis first about negative MRI and CT. Dr Anne HahnWillis referred this writer to Dr Tobi BastosPyreddy the admitting doctor. Dr Tobi BastosPyreddy said to discontinue stoke orders and place pt on CIWA since he does drink 3-4 beers daily. Pt failed stroke swallow screen in the ED. Md gave telephone order for heart healthy diet since pt has been swallowing fine. Pt tolerated ginger ale and crackers without difficulty

## 2018-05-01 LAB — HIV ANTIBODY (ROUTINE TESTING W REFLEX): HIV Screen 4th Generation wRfx: NONREACTIVE

## 2018-05-01 MED ORDER — LISINOPRIL 20 MG PO TABS: 20.0000 mg | ORAL_TABLET | Freq: Every day | ORAL | Status: AC

## 2018-05-01 MED ORDER — ASPIRIN EC 81 MG PO TBEC: 81.0000 mg | DELAYED_RELEASE_TABLET | Freq: Every day | ORAL | 2 refills | Status: AC

## 2018-05-01 NOTE — Discharge Summary (Signed)
[facility] - [place] at [facility], [place] y.o., DOB 04-Dec-1965, MRN [id number]. Admission date: 04/29/2018 Discharge Date 05/01/2018 Primary MD Barbette Reichmann, MD Admitting Physician Ihor Austin, MD  Admission Diagnosis  Dizziness [R42] Slurred speech [R47.81]  Discharge Diagnosis   Active Problems: TIA Diarrhea with stool studies being negative Axillary hypertension Dizziness and dehydration Alcohol abuse     Hospital Course  52 year old male patient with history of hyper pressure presented to the emergency room with dizziness, weakness and slurred speech. Patient was admitted for further evaluation.  He underwent a work-up with MRI of the brain which showed chronic small vessel disease.  Patient also had carotid echo and Dopplers which showed no abnormality.  Patient was complained of diarrhea stool studies were negative for any type of bacterial viral infection.  His diarrhea has since resolved.  Patient does drink beer we strongly recommend he stop drinking beer on daily basis.            Consults  None  Significant Tests:  See full reports for all details     Ct Head Wo Contrast  Result Date: 04/29/2018 CLINICAL DATA:  Dizziness, nausea, and right hand tingling. Slurred speech. Visual disturbance. EXAM: CT HEAD WITHOUT CONTRAST TECHNIQUE: Contiguous axial images were obtained from the base of the skull through the vertex without intravenous contrast. COMPARISON:  None. FINDINGS: Brain: There is no evidence of acute infarct, intracranial hemorrhage, mass, midline shift, or extra-axial fluid collection. The ventricles and sulci are normal. Vascular: No hyperdense vessel. Skull: No fracture or focal osseous lesion. Sinuses/Orbits: Visualized paranasal sinuses and mastoid air cells are clear. Orbits are unremarkable. Other: None. IMPRESSION: Negative head CT. Electronically Signed   By: Sebastian Ache M.D.   On: 04/29/2018 14:23   Mr Maxine Glenn  Head Wo Contrast  Result Date: 04/29/2018 CLINICAL DATA:  Dizziness, nausea, and right hand numbness and tingling. Slurred speech. Symptoms have resolved. EXAM: MRI HEAD WITHOUT CONTRAST MRA HEAD WITHOUT CONTRAST TECHNIQUE: Multiplanar, multiecho pulse sequences of the brain and surrounding structures were obtained without intravenous contrast. Angiographic images of the head were obtained using MRA technique without contrast. COMPARISON:  Head CT 04/29/2018 FINDINGS: MRI HEAD FINDINGS Brain: There is no evidence of acute infarct, intracranial hemorrhage, mass, midline shift, or extra-axial fluid collection. The ventricles and sulci are normal. There are several scattered punctate foci of T2 hyperintensity in the cerebral white matter. Vascular: Major intracranial vascular flow voids are preserved. Skull and upper cervical spine: Unremarkable bone marrow signal. Sinuses/Orbits: Unremarkable orbits. Minimal mucosal thickening in the maxillary sinuses. No significant mastoid fluid. Other: None. MRA HEAD FINDINGS The visualized distal vertebral arteries are widely patent to the basilar with the right being mildly dominant. Patent right PICA and bilateral SCA origins are visualized. Left PICA and bilateral AICA is are not identified. The basilar artery is widely patent. There is a small right posterior communicating artery. The PCAs are patent without evidence of significant stenosis. The internal carotid arteries are widely patent from skull base to carotid termini. The ACAs and MCAs are patent without evidence of proximal branch occlusion or significant stenosis. No aneurysm is identified. IMPRESSION: 1. No acute intracranial abnormality. 2. Minimal cerebral white matter T2 signal changes, nonspecific though may reflect early chronic small vessel ischemia, migraines, or prior infection/inflammation. 3. Negative head MRA. Electronically Signed   By: Sebastian Ache M.D.   On: 04/29/2018 17:03   Mr Brain Wo  Contrast  Result Date: 04/29/2018 CLINICAL DATA:  Dizziness, nausea, and right hand numbness and tingling. Slurred speech. Symptoms have resolved. EXAM: MRI HEAD WITHOUT CONTRAST MRA HEAD WITHOUT CONTRAST TECHNIQUE: Multiplanar, multiecho pulse sequences of the brain and surrounding structures were obtained without intravenous contrast. Angiographic images of the head were obtained using MRA technique without contrast. COMPARISON:  Head CT 04/29/2018 FINDINGS: MRI HEAD FINDINGS Brain: There is no evidence of acute infarct, intracranial hemorrhage, mass, midline shift, or extra-axial fluid collection. The ventricles and sulci are normal. There are several scattered punctate foci of T2 hyperintensity in the cerebral white matter. Vascular: Major intracranial vascular flow voids are preserved. Skull and upper cervical spine: Unremarkable bone marrow signal. Sinuses/Orbits: Unremarkable orbits. Minimal mucosal thickening in the maxillary sinuses. No significant mastoid fluid. Other: None. MRA HEAD FINDINGS The visualized distal vertebral arteries are widely patent to the basilar with the right being mildly dominant. Patent right PICA and bilateral SCA origins are visualized. Left PICA and bilateral AICA is are not identified. The basilar artery is widely patent. There is a small right posterior communicating artery. The PCAs are patent without evidence of significant stenosis. The internal carotid arteries are widely patent from skull base to carotid termini. The ACAs and MCAs are patent without evidence of proximal branch occlusion or significant stenosis. No aneurysm is identified. IMPRESSION: 1. No acute intracranial abnormality. 2. Minimal cerebral white matter T2 signal changes, nonspecific though may reflect early chronic small vessel ischemia, migraines, or prior infection/inflammation. 3. Negative head MRA. Electronically Signed   By: Sebastian Ache M.D.   On: 04/29/2018 17:03       Today   Subjective:    Tony Lang patient feeling much better wants to go home Objective:   Blood pressure (!) 163/93, pulse (!) 52, temperature 98.3 F (36.8 C), temperature source Oral, resp. rate 16, height 5\' 11"  (1.803 m), weight 76.6 kg (168 lb 12.8 oz), SpO2 (!) 75 %.  .  Intake/Output Summary (Last 24 hours) at 05/01/2018 1404 Last data filed at 05/01/2018 0317 Gross per 24 hour  Intake 1065 ml  Output -  Net 1065 ml    Exam VITAL SIGNS: Blood pressure (!) 163/93, pulse (!) 52, temperature 98.3 F (36.8 C), temperature source Oral, resp. rate 16, height 5\' 11"  (1.803 m), weight 76.6 kg (168 lb 12.8 oz), SpO2 (!) 75 %.  GENERAL:  52 y.o.-year-old patient lying in the bed with no acute distress.  EYES: Pupils equal, round, reactive to light and accommodation. No scleral icterus. Extraocular muscles intact.  HEENT: Head atraumatic, normocephalic. Oropharynx and nasopharynx clear.  NECK:  Supple, no jugular venous distention. No thyroid enlargement, no tenderness.  LUNGS: Normal breath sounds bilaterally, no wheezing, rales,rhonchi or crepitation. No use of accessory muscles of respiration.  CARDIOVASCULAR: S1, S2 normal. No murmurs, rubs, or gallops.  ABDOMEN: Soft, nontender, nondistended. Bowel sounds present. No organomegaly or mass.  EXTREMITIES: No pedal edema, cyanosis, or clubbing.  NEUROLOGIC: Cranial nerves II through XII are intact. Muscle strength 5/5 in all extremities. Sensation intact. Gait not checked.  PSYCHIATRIC: The patient is alert and oriented x 3.  SKIN: No obvious rash, lesion, or ulcer.   Data Review     CBC w Diff:  Lab Results  Component Value Date   WBC 13.4 (H) 04/29/2018   HGB 14.0 04/29/2018   HCT 39.2 (L) 04/29/2018   PLT 304 04/29/2018   CMP:  Lab Results  Component Value Date   NA 141 04/29/2018   K 3.6 04/29/2018   CL  103 04/29/2018   CO2 26 04/29/2018   BUN 15 04/29/2018   CREATININE 0.73 04/29/2018   PROT 7.1 04/30/2018   ALBUMIN 4.1  04/30/2018   BILITOT 0.9 04/30/2018   ALKPHOS 75 04/30/2018   AST 35 04/30/2018   ALT 14 04/30/2018  .  Micro Results Recent Results (from the past 240 hour(s))  Gastrointestinal Panel by PCR , Stool     Status: None   Collection Time: 04/30/18  4:05 PM  Result Value Ref Range Status   Campylobacter species NOT DETECTED NOT DETECTED Final   Plesimonas shigelloides NOT DETECTED NOT DETECTED Final   Salmonella species NOT DETECTED NOT DETECTED Final   Yersinia enterocolitica NOT DETECTED NOT DETECTED Final   Vibrio species NOT DETECTED NOT DETECTED Final   Vibrio cholerae NOT DETECTED NOT DETECTED Final   Enteroaggregative E coli (EAEC) NOT DETECTED NOT DETECTED Final   Enteropathogenic E coli (EPEC) NOT DETECTED NOT DETECTED Final   Enterotoxigenic E coli (ETEC) NOT DETECTED NOT DETECTED Final   Shiga like toxin producing E coli (STEC) NOT DETECTED NOT DETECTED Final   Shigella/Enteroinvasive E coli (EIEC) NOT DETECTED NOT DETECTED Final   Cryptosporidium NOT DETECTED NOT DETECTED Final   Cyclospora cayetanensis NOT DETECTED NOT DETECTED Final   Entamoeba histolytica NOT DETECTED NOT DETECTED Final   Giardia lamblia NOT DETECTED NOT DETECTED Final   Adenovirus F40/41 NOT DETECTED NOT DETECTED Final   Astrovirus NOT DETECTED NOT DETECTED Final   Norovirus GI/GII NOT DETECTED NOT DETECTED Final   Rotavirus A NOT DETECTED NOT DETECTED Final   Sapovirus (I, II, IV, and V) NOT DETECTED NOT DETECTED Final    Comment: Performed at [facility], [address] [place], [place] [address]        Code Status Orders  (From admission, onward)        Start     Ordered   04/29/18 2109  Full code  Continuous     04/29/18 2108    Code Status History    This patient has a current code status but no historical code status.          Follow-up Information    Barbette ReichmannHande, Vishwanath, MD On 05/09/2018.   Specialty:  Internal Medicine Why:  @8 :30am Contact information: [address] [facility] [place] [address] [phone]           Discharge Medications   Allergies as of 05/01/2018   No Known Allergies     Medication List    TAKE these medications   aspirin EC 81 MG tablet Take 1 tablet (81 mg total) by mouth daily.   lisinopril 20 MG tablet Commonly known as:  PRINIVIL,ZESTRIL Take 1 tablet (20 mg total) by mouth daily. What changed:  how much to take   tamsulosin 0.4 MG Caps capsule Commonly known as:  FLOMAX Take 0.4 mg by mouth daily as needed (prostate).   traMADol 50 MG tablet Commonly known as:  ULTRAM Take 25 mg by mouth daily as needed for moderate pain.          Total Time in preparing paper work, data evaluation and todays exam - 35 minutes  Auburn BilberryShreyang Luman Holway M.D on 05/01/2018 at 2:04 PM [facility]   Office  [phone]

## 2018-05-01 NOTE — Progress Notes (Addendum)
Pt VS stable. A&Ox4 with no confusion. Discharge information given to patient. First dose medication information given. Pt questions answered. TIA information reviewed. Drinking and smoking cesation reviewed. Pt denied questions. Pt instructed to keep follow up appointments. IVs removed. Pt to be transported home via aunt. Monitored until discharge Kinnie ScalesKim Raghav Verrilli, LPN

## 2019-07-11 ENCOUNTER — Other Ambulatory Visit: Payer: Self-pay

## 2019-07-11 DIAGNOSIS — Z20822 Contact with and (suspected) exposure to covid-19: Secondary | ICD-10-CM

## 2019-07-13 LAB — NOVEL CORONAVIRUS, NAA: SARS-CoV-2, NAA: NOT DETECTED

## 2019-12-13 IMAGING — CT CT HEAD W/O CM
3 series · 16 of 47 positions shown, 19 images · non-contrast
Comparison: None.

CLINICAL DATA: Dizziness, nausea, and right hand tingling. Slurred
speech. Visual disturbance.

EXAM:
CT HEAD WITHOUT CONTRAST
TECHNIQUE: Contiguous axial images were obtained from the base of the skull
through the vertex without intravenous contrast.

[Series 2: head wo · axial · 0.47mm/px · z∈[+532,+657]mm · 10 of 31 slices shown, 13 images]
[im 3/31  brain]
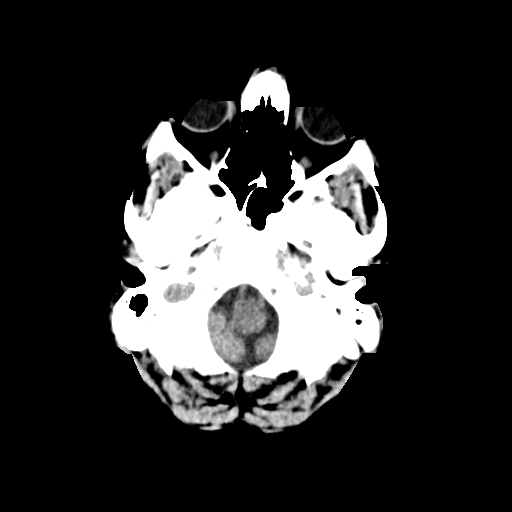
[im 3/31  bone]
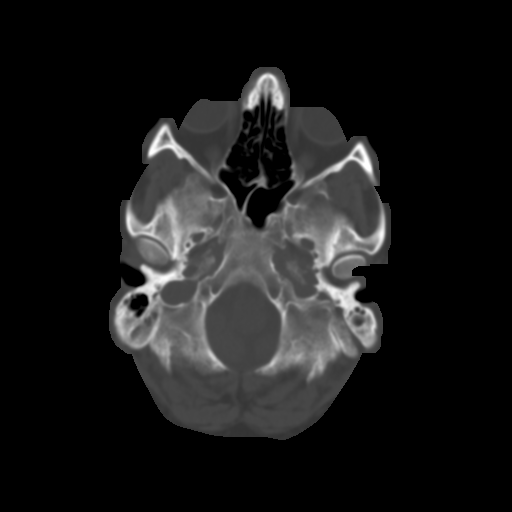
[im 6/31  brain]
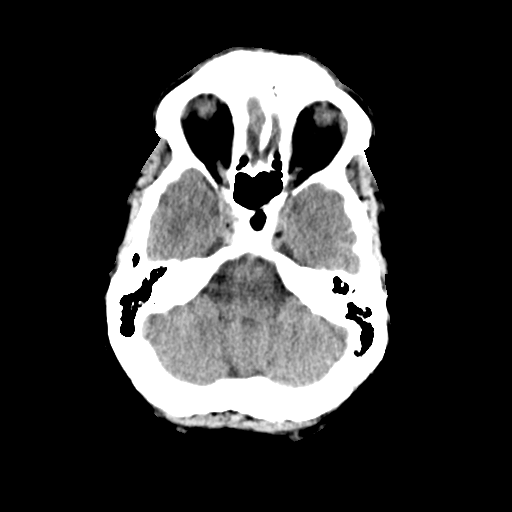
[im 9/31  brain]
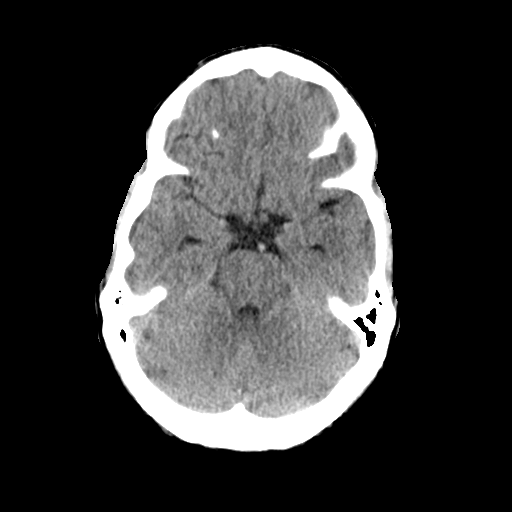
[im 11/31  brain]
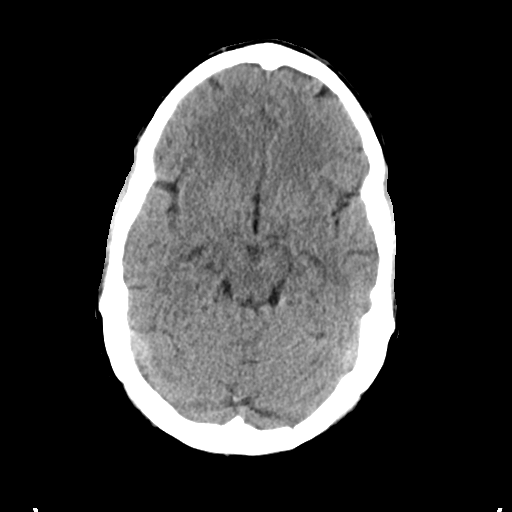
[im 14/31  brain]
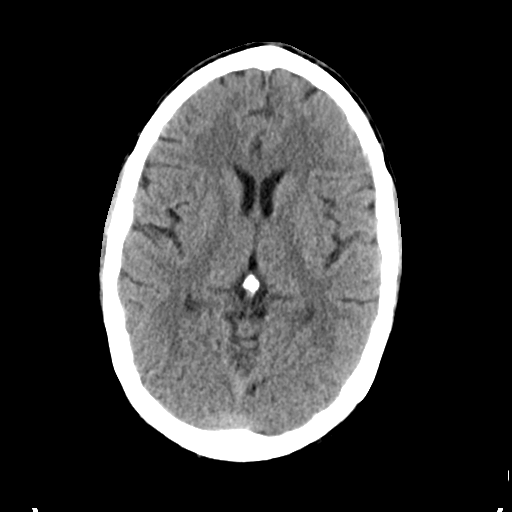
[im 14/31  bone]
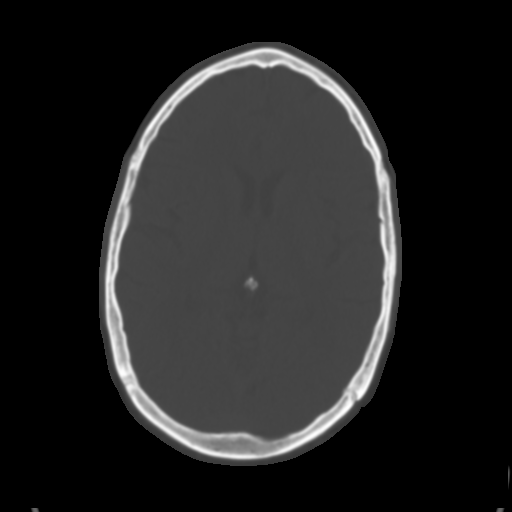
[im 17/31  brain]
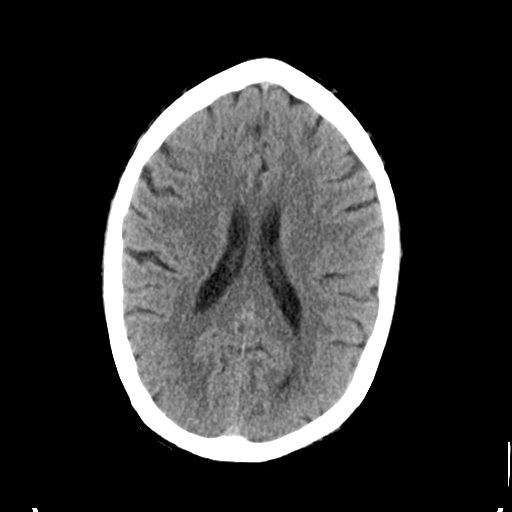
[im 20/31  brain]
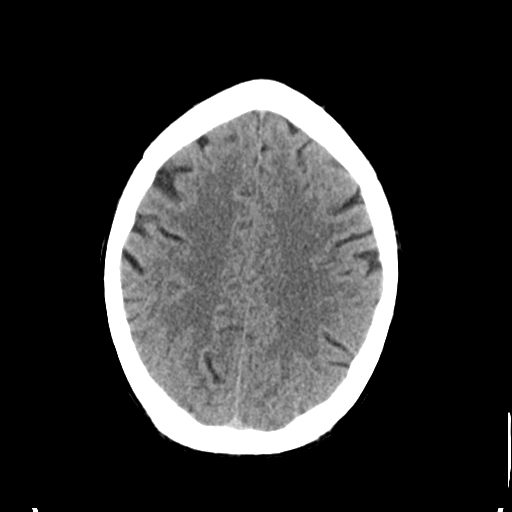
[im 23/31  brain]
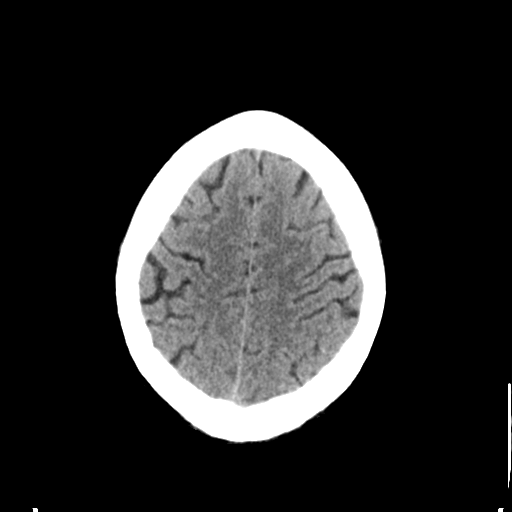
[im 25/31  brain]
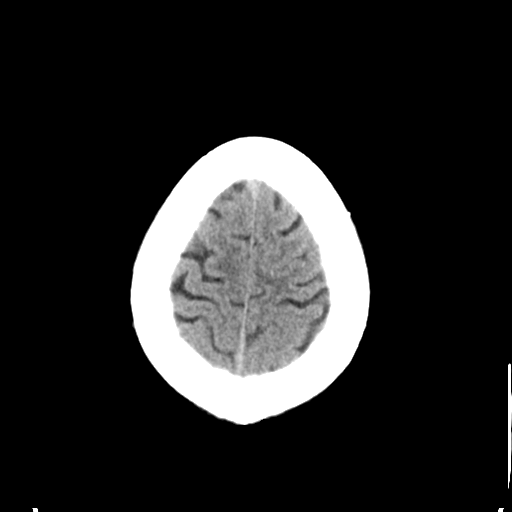
[im 25/31  bone]
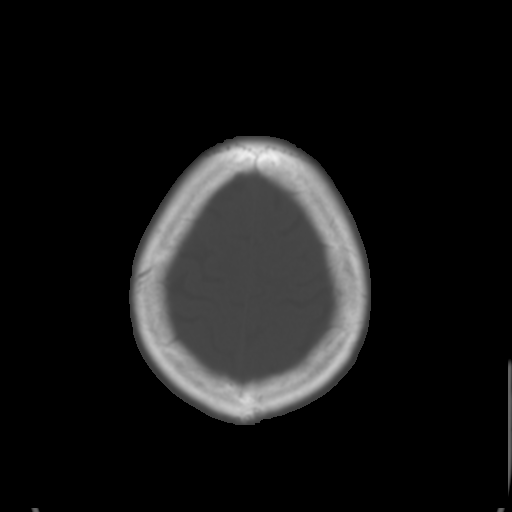
[im 28/31  brain]
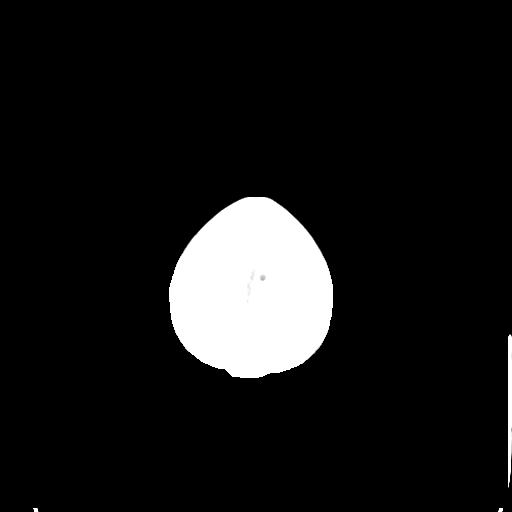

[Series 4: coronal soft tissue · coronal · 0.31mm/px · 3 of 66 slices shown]
[im 22/66  brain]
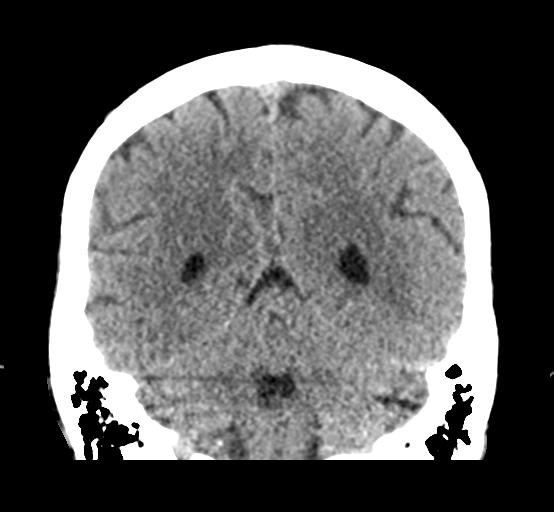
[im 29/66  brain]
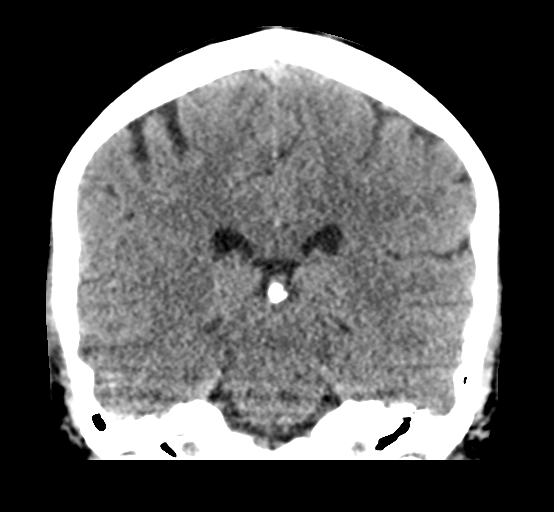
[im 37/66  brain]
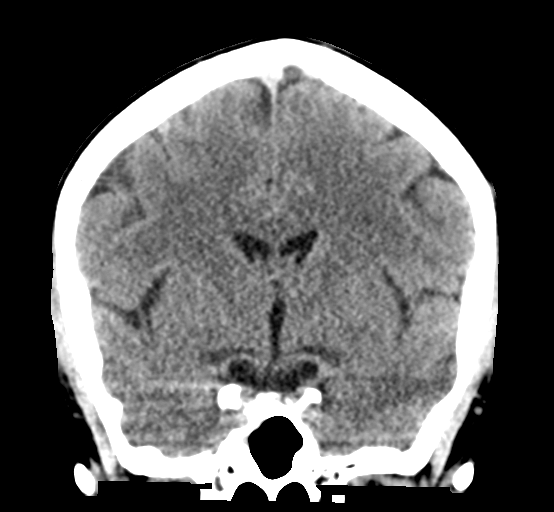

[Series 5: sagittal soft tissue · sagittal · 0.32mm/px · 3 of 49 slices shown]
[im 17/49  brain]
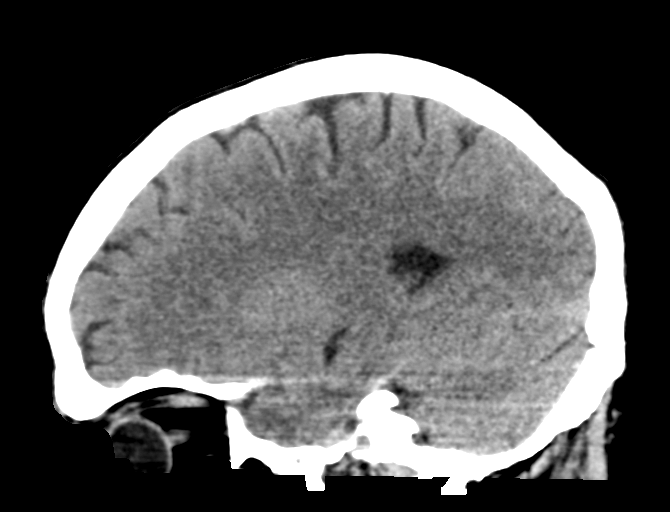
[im 25/49  brain]
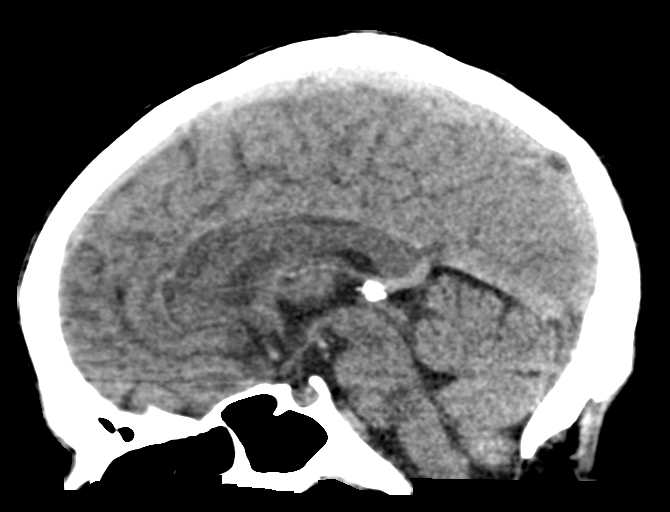
[im 33/49  brain]
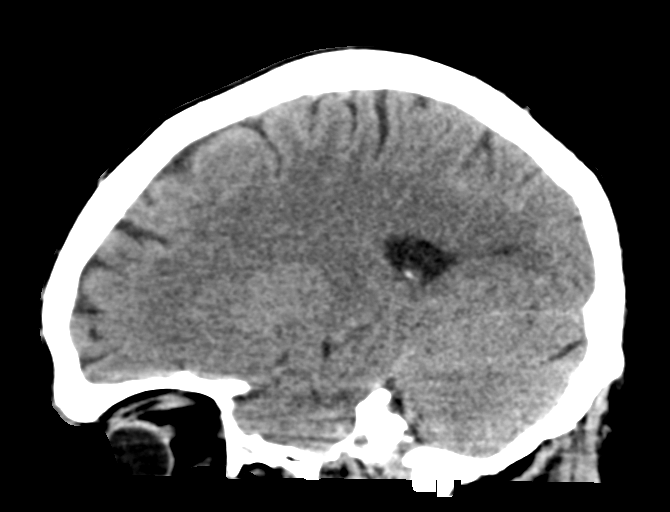

[16 of 47 positions shown; findings below may reference images not displayed]

FINDINGS: Brain: There is no evidence of acute infarct, intracranial
hemorrhage, mass, midline shift, or extra-axial fluid collection.
The ventricles and sulci are normal.

Vascular: No hyperdense vessel.

Skull: No fracture or focal osseous lesion.

Sinuses/Orbits: Visualized paranasal sinuses and mastoid air cells
are clear. Orbits are unremarkable.

Other: None.
IMPRESSION: Negative head CT.

## 2020-01-29 ENCOUNTER — Ambulatory Visit: Payer: Self-pay | Attending: Internal Medicine

## 2020-01-29 DIAGNOSIS — Z23 Encounter for immunization: Secondary | ICD-10-CM

## 2020-01-29 NOTE — Progress Notes (Signed)
   Covid-19 Vaccination Clinic  Name:  Tony Lang    MRN: [id number] DOB: [birth year]  01/29/2020  Tony Lang was observed post Covid-19 immunization for 15 minutes without incident. He was provided with Vaccine Information Sheet and instruction to access the V-Safe system.   Tony Lang was instructed to call 911 with any severe reactions post vaccine: Marland Kitchen Difficulty breathing  . Swelling of face and throat  . A fast heartbeat  . A bad rash all over body  . Dizziness and weakness   Immunizations Administered    Name Date Dose VIS Date Route   Pfizer COVID-19 Vaccine 01/29/2020 11:27 AM 0.3 mL 10/10/2019 Intramuscular   Manufacturer: [employer], [employer]   Lot: [id number]   NDC: [address]-9

## 2020-02-24 ENCOUNTER — Ambulatory Visit: Payer: Self-pay

## 2020-02-24 ENCOUNTER — Ambulatory Visit: Payer: Self-pay | Attending: Internal Medicine

## 2020-02-24 DIAGNOSIS — Z23 Encounter for immunization: Secondary | ICD-10-CM

## 2020-02-24 NOTE — Progress Notes (Signed)
   Covid-19 Vaccination Clinic  Name:  Tony Lang    MRN: [id number] DOB: Oct 31, 1965  02/24/2020  [name] was observed post Covid-19 immunization for 15 minutes without incident. He was provided with Vaccine Information Sheet and instruction to access the V-Safe system.   [name] was instructed to call 911 with any severe reactions post vaccine: Marland Kitchen Difficulty breathing  . Swelling of face and throat  . A fast heartbeat  . A bad rash all over body  . Dizziness and weakness   Immunizations Administered    Name Date Dose VIS Date Route   Pfizer COVID-19 Vaccine 02/24/2020  4:36 PM 0.3 mL 12/24/2018 Intramuscular   Manufacturer: [employer], [employer]   Lot: [id number]   NDC: [address]-3

## 2021-09-30 ENCOUNTER — Ambulatory Visit: Payer: Self-pay | Admitting: General Surgery

## 2021-09-30 NOTE — H&P (Signed)
PATIENT PROFILE: Tony Lang is a 55 y.o. male who presents to the Clinic for preoperative evaluation of right inguinal hernia.  PCP:  Azzie Glatter, MD   HISTORY OF PRESENT ILLNESS: Tony Lang was previously seen for evaluation of right inguinal hernia.  He has been dealing with this hernia for almost a year.  This hernia is causing discomfort.  Discomfort localized to the right groin.  No pain radiation.  Aggravating factor is increasing intra-abdominal pressure.  No alleviating factors identified.  Patient is able to reduce the hernia.  He endorses that since the last evaluation he has been slowly increasing in size.  No severe pain episode.  No episode of abdominal distention nausea or vomiting.   PROBLEM LIST: Problem List  Date Reviewed: 06/27/2021          Noted   Unilateral inguinal hernia without obstruction or gangrene 12/27/2020   Benign prostatic hyperplasia with lower urinary tract symptoms 12/27/2020   Vitamin D deficiency 01/06/2016   Vitamin B12 deficiency 01/06/2016   Arthralgia of multiple sites 12/22/2015   Patellofemoral syndrome of both knees 12/22/2015   Myalgia 12/22/2015   Hypertension 12/22/2015    GENERAL REVIEW OF SYSTEMS:   General ROS: negative for - chills, fatigue, fever, weight gain or weight loss Allergy and Immunology ROS: negative for - hives  Hematological and Lymphatic ROS: negative for - bleeding problems or bruising, negative for palpable nodes Endocrine ROS: negative for - heat or cold intolerance, hair changes Respiratory ROS: negative for - cough, shortness of breath or wheezing Cardiovascular ROS: no chest pain or palpitations GI ROS: negative for nausea, vomiting, abdominal pain, diarrhea, constipation Musculoskeletal ROS: negative for - joint swelling or muscle pain Neurological ROS: negative for - confusion, syncope Dermatological ROS: negative for pruritus and rash Psychiatric: negative for anxiety, depression,  difficulty sleeping and memory loss  MEDICATIONS: Current Outpatient Medications  Medication Sig Dispense Refill   doxycycline (VIBRAMYCIN) 100 MG capsule Take 1 capsule (100 mg total) by mouth 2 (two) times daily for 10 days 20 capsule 0   lisinopriL (ZESTRIL) 20 MG tablet Take 1 tablet (20 mg total) by mouth once daily 90 tablet 3   multivitamin tablet Take 1 tablet by mouth once daily     tamsulosin (FLOMAX) 0.4 mg capsule Take 1 capsule (0.4 mg total) by mouth once daily 30 MINUTES AFTER THE SAME MEAL 90 capsule 1   aspirin 81 MG EC tablet Take 81 mg by mouth once daily (Patient not taking: Reported on 06/27/2021)     No current facility-administered medications for this visit.    ALLERGIES: Patient has no known allergies.  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   COLONOSCOPY  09/11/2018   Hyperplastic colon polyp/Repeat 58yrs/MUS   FRACTURE SURGERY  05/2010   left radius   OPEN REDUCTION, INTERNAL FIXATION LEFT RADIAL SHAFT  06/16/2010     FAMILY HISTORY: Family History  Problem Relation Age of Onset   Myocardial Infarction (Heart attack) Father    Stroke Father    No Known Problems Mother  SOCIAL HISTORY: Social History   Socioeconomic History   Marital status: Married  Tobacco Use   Smoking status: Never   Smokeless tobacco: Never  Vaping Use   Vaping Use: Never used  Substance and Sexual Activity   Alcohol use: Yes    Alcohol/week: 7.0 - 14.0 standard drinks    Types: 7 - 14 Cans of beer per week    Comment: A beer a day.   Drug use: No   Sexual activity: Yes    Partners: Female    PHYSICAL  EXAM: Vitals:   09/30/21 0743  BP: (!) 163/96  Pulse: 71   Body mass index is 24.82 kg/m. Weight: 78.5 kg (173 lb)   GENERAL: Alert, active, oriented x3  HEENT: Pupils equal reactive to light. Extraocular movements are intact. Sclera clear. Palpebral conjunctiva normal red color.Pharynx clear.  NECK: Supple with no palpable mass and no adenopathy.  LUNGS: Sound clear with no rales rhonchi or wheezes.  HEART: Regular rhythm S1 and S2 without murmur.  ABDOMEN: Soft and depressible, nontender with no palpable mass, no hepatomegaly.  Right annual hernia, soft, reducible.  No left Identified.  EXTREMITIES: Well-developed well-nourished symmetrical with no dependent edema.  NEUROLOGICAL: Awake alert oriented, facial expression symmetrical, moving all extremities.  REVIEW OF DATA: I have reviewed the following data today: Appointment on 08/08/2021  Component Date Value   WBC (White Blood Cell Co* 08/08/2021 7.9    RBC (Red Blood Cell Coun* 08/08/2021 4.10 (L)    Hemoglobin 08/08/2021 13.5 (L)    Hematocrit 08/08/2021 39.3 (L)    MCV (Mean Corpuscular Vo* 08/08/2021 95.9    MCH (Mean Corpuscular He* 08/08/2021 32.9 (H)    MCHC (Mean Corpuscular H* 08/08/2021 34.4    Platelet Count 08/08/2021 287    RDW-CV (Red Cell Distrib* 08/08/2021 12.1    MPV (Mean Platelet Volum* 08/08/2021 10.1    Neutrophils 08/08/2021 4.47    Lymphocytes 08/08/2021 2.01    Monocytes 08/08/2021 0.85    Eosinophils 08/08/2021 0.50    Basophils 08/08/2021 0.07    Neutrophil % 08/08/2021 56.4    Lymphocyte % 08/08/2021 25.4    Monocyte % 08/08/2021 10.7    Eosinophil % 08/08/2021 6.3 (H)    Basophil% 08/08/2021 0.9    Immature Granulocyte % 08/08/2021 0.3    Immature Granulocyte Cou* 08/08/2021 0.02      ASSESSMENT: [name] is a 55 y.o. male presenting for consultation for right inguinal hernia.    The patient presents with a symptomatic, reducible right inguinal hernia. Patient was  oriented again about the diagnosis of inguinal hernia and its implication. The patient was oriented about the treatment alternatives (observation vs surgical repair). Due to patient symptoms, repair is recommended. Patient oriented about the surgical procedure, the use of mesh and its risk of complications such as: infection, bleeding, injury to vas deference, vasculature and testicle, injury to bowel or bladder, and chronic pain.   Non-recurrent unilateral inguinal hernia without obstruction or gangrene [K40.90]  PLAN: 1.  Robotic assisted laparoscopic right inguinal hernia repair with mesh [id number]) 2.  CBC, CMP 3.  Avoid taking aspirin 5 days before procedure 4.  Contact us if has any question or concern.   Patient verbalized understanding, all questions were answered, and were agreeable with the plan outlined above.    Herbert Pun, MD  Electronically signed by Herbert Pun, MD

## 2021-09-30 NOTE — H&P (View-Only) (Signed)
PATIENT PROFILE: Tony Lang is a 55 y.o. male who presents to the Clinic for preoperative evaluation of right inguinal hernia.  PCP:  Azzie Glatter, MD   HISTORY OF PRESENT ILLNESS: Tony Lang was previously seen for evaluation of right inguinal hernia.  He has been dealing with this hernia for almost a year.  This hernia is causing discomfort.  Discomfort localized to the right groin.  No pain radiation.  Aggravating factor is increasing intra-abdominal pressure.  No alleviating factors identified.  Patient is able to reduce the hernia.  He endorses that since the last evaluation he has been slowly increasing in size.  No severe pain episode.  No episode of abdominal distention nausea or vomiting.   PROBLEM LIST: Problem List  Date Reviewed: 06/27/2021          Noted   Unilateral inguinal hernia without obstruction or gangrene 12/27/2020   Benign prostatic hyperplasia with lower urinary tract symptoms 12/27/2020   Vitamin D deficiency 01/06/2016   Vitamin B12 deficiency 01/06/2016   Arthralgia of multiple sites 12/22/2015   Patellofemoral syndrome of both knees 12/22/2015   Myalgia 12/22/2015   Hypertension 12/22/2015    GENERAL REVIEW OF SYSTEMS:   General ROS: negative for - chills, fatigue, fever, weight gain or weight loss Allergy and Immunology ROS: negative for - hives  Hematological and Lymphatic ROS: negative for - bleeding problems or bruising, negative for palpable nodes Endocrine ROS: negative for - heat or cold intolerance, hair changes Respiratory ROS: negative for - cough, shortness of breath or wheezing Cardiovascular ROS: no chest pain or palpitations GI ROS: negative for nausea, vomiting, abdominal pain, diarrhea, constipation Musculoskeletal ROS: negative for - joint swelling or muscle pain Neurological ROS: negative for - confusion, syncope Dermatological ROS: negative for pruritus and rash Psychiatric: negative for anxiety, depression,  difficulty sleeping and memory loss  MEDICATIONS: Current Outpatient Medications  Medication Sig Dispense Refill   doxycycline (VIBRAMYCIN) 100 MG capsule Take 1 capsule (100 mg total) by mouth 2 (two) times daily for 10 days 20 capsule 0   lisinopriL (ZESTRIL) 20 MG tablet Take 1 tablet (20 mg total) by mouth once daily 90 tablet 3   multivitamin tablet Take 1 tablet by mouth once daily     tamsulosin (FLOMAX) 0.4 mg capsule Take 1 capsule (0.4 mg total) by mouth once daily 30 MINUTES AFTER THE SAME MEAL 90 capsule 1   aspirin 81 MG EC tablet Take 81 mg by mouth once daily (Patient not taking: Reported on 06/27/2021)     No current facility-administered medications for this visit.    ALLERGIES: Patient has no known allergies.  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   COLONOSCOPY  09/11/2018   Hyperplastic colon polyp/Repeat 51yrs/MUS   FRACTURE SURGERY  05/2010   left radius   OPEN REDUCTION, INTERNAL FIXATION LEFT RADIAL SHAFT  06/16/2010     FAMILY HISTORY: Family History  Problem Relation Age of Onset   Myocardial Infarction (Heart attack) Father    Stroke Father    No Known Problems Mother  SOCIAL HISTORY: Social History   Socioeconomic History   Marital status: Married  Tobacco Use   Smoking status: Never   Smokeless tobacco: Never  Vaping Use   Vaping Use: Never used  Substance and Sexual Activity   Alcohol use: Yes    Alcohol/week: 7.0 - 14.0 standard drinks    Types: 7 - 14 Cans of beer per week    Comment: A beer a day.   Drug use: No   Sexual activity: Yes    Partners: Female    PHYSICAL  EXAM: Vitals:   09/30/21 0743  BP: (!) 163/96  Pulse: 71   Body mass index is 24.82 kg/m. Weight: 78.5 kg (173 lb)   GENERAL: Alert, active, oriented x3  HEENT: Pupils equal reactive to light. Extraocular movements are intact. Sclera clear. Palpebral conjunctiva normal red color.Pharynx clear.  NECK: Supple with no palpable mass and no adenopathy.  LUNGS: Sound clear with no rales rhonchi or wheezes.  HEART: Regular rhythm S1 and S2 without murmur.  ABDOMEN: Soft and depressible, nontender with no palpable mass, no hepatomegaly.  Right annual hernia, soft, reducible.  No left Identified.  EXTREMITIES: Well-developed well-nourished symmetrical with no dependent edema.  NEUROLOGICAL: Awake alert oriented, facial expression symmetrical, moving all extremities.  REVIEW OF DATA: I have reviewed the following data today: Appointment on 08/08/2021  Component Date Value   WBC (White Blood Cell Co* 08/08/2021 7.9    RBC (Red Blood Cell Coun* 08/08/2021 4.10 (L)    Hemoglobin 08/08/2021 13.5 (L)    Hematocrit 08/08/2021 39.3 (L)    MCV (Mean Corpuscular Vo* 08/08/2021 95.9    MCH (Mean Corpuscular He* 08/08/2021 32.9 (H)    MCHC (Mean Corpuscular H* 08/08/2021 34.4    Platelet Count 08/08/2021 287    RDW-CV (Red Cell Distrib* 08/08/2021 12.1    MPV (Mean Platelet Volum* 08/08/2021 10.1    Neutrophils 08/08/2021 4.47    Lymphocytes 08/08/2021 2.01    Monocytes 08/08/2021 0.85    Eosinophils 08/08/2021 0.50    Basophils 08/08/2021 0.07    Neutrophil % 08/08/2021 56.4    Lymphocyte % 08/08/2021 25.4    Monocyte % 08/08/2021 10.7    Eosinophil % 08/08/2021 6.3 (H)    Basophil% 08/08/2021 0.9    Immature Granulocyte % 08/08/2021 0.3    Immature Granulocyte Cou* 08/08/2021 0.02      ASSESSMENT: Tony Lang is a 55 y.o. male presenting for consultation for right inguinal hernia.    The patient presents with a symptomatic, reducible right inguinal hernia. Patient was  oriented again about the diagnosis of inguinal hernia and its implication. The patient was oriented about the treatment alternatives (observation vs surgical repair). Due to patient symptoms, repair is recommended. Patient oriented about the surgical procedure, the use of mesh and its risk of complications such as: infection, bleeding, injury to vas deference, vasculature and testicle, injury to bowel or bladder, and chronic pain.   Non-recurrent unilateral inguinal hernia without obstruction or gangrene [K40.90]  PLAN: 1.  Robotic assisted laparoscopic right inguinal hernia repair with mesh ([id number]) 2.  CBC, CMP 3.  Avoid taking aspirin 5 days before procedure 4.  Contact us if has any question or concern.   Patient verbalized understanding, all questions were answered, and were agreeable with the plan outlined above.    Carolan Shiver, MD  Electronically signed by Carolan Shiver, MD

## 2021-10-11 ENCOUNTER — Encounter
Admission: RE | Admit: 2021-10-11 | Discharge: 2021-10-11 | Disposition: A | Discharge status: Home / Self Care | Payer: 59 | Source: Ambulatory Visit | Attending: General Surgery | Admitting: General Surgery

## 2021-10-11 ENCOUNTER — Other Ambulatory Visit: Payer: Self-pay

## 2021-10-11 HISTORY — DX: Benign prostatic hyperplasia without lower urinary tract symptoms: N40.0

## 2021-10-11 HISTORY — DX: Transient cerebral ischemic attack, unspecified: G45.9

## 2021-10-11 NOTE — Patient Instructions (Signed)
Your procedure is scheduled on: 10/19/21 Report to DAY SURGERY DEPARTMENT LOCATED ON 2ND FLOOR MEDICAL MALL ENTRANCE. To find out your arrival time please call [phone] between 1PM - 3PM on 10/18/21.  Remember: Instructions that are not followed completely may result in serious medical risk, up to and including death, or upon the discretion of your surgeon and anesthesiologist your surgery may need to be rescheduled.     _X__ 1. Do not eat food or drink any liquids after midnight the night before your procedure.                 No gum chewing or hard candies.   __X__2.  On the morning of surgery brush your teeth with toothpaste and water, you                 may rinse your mouth with mouthwash if you wish.  Do not swallow any              toothpaste of mouthwash.     _X__ 3.  No Alcohol for 24 hours before or after surgery.   _X__ 4.  Do Not Smoke or use e-cigarettes For 24 Hours Prior to Your Surgery.                 Do not use any chewable tobacco products for at least 6 hours prior to                 surgery.  ____  5.  Bring all medications with you on the day of surgery if instructed.   __X__  6.  Notify your doctor if there is any change in your medical condition      (cold, fever, infections).     Do not wear jewelry, make-up, hairpins, clips or nail polish. Do not wear lotions, powders, or perfumes.  Do not shave body hair  48 hours prior to surgery. Men may shave face and neck. Do not bring valuables to the hospital.    [facility] is not responsible for any belongings or valuables.  Contacts, dentures/partials or body piercings may not be worn into surgery. Bring a case for your contacts, glasses or hearing aids, a denture cup will be supplied. Leave your suitcase in the car. After surgery it may be brought to your room. For patients admitted to the hospital, discharge time is determined by your treatment team.   Patients discharged the day of surgery will not be  allowed to drive home.   Please read over the following fact sheets that you were given:   CHG SOAP  __X__ Take these medicines the morning of surgery with A SIP OF WATER:    1. tamsulosin (FLOMAX) 0.4 MG CAPS capsule  2.   3.   4.  5.  6.  ____ Fleet Enema (as directed)   __X__ Use CHG Soap/SAGE wipes as directed  ____ Use inhalers on the day of surgery  ____ Stop metformin/Janumet/Farxiga 2 days prior to surgery    ____ Take 1/2 of usual insulin dose the night before surgery. No insulin the morning          of surgery.   ____ Stop Blood Thinners Coumadin/Plavix/Xarelto/Pleta/Pradaxa/Eliquis/Effient/Aspirin  on   Or contact your Surgeon, Cardiologist or Medical Doctor regarding  ability to stop your blood thinners  __X__ Stop Anti-inflammatories 7 days before surgery such as Advil, Ibuprofen, Motrin,  BC or Goodies Powder, Naprosyn, Naproxen, Aleve, Aspirin    __X__ Stop  all herbal supplements, fish oil or vitamin E until after surgery.    ____ Bring C-Pap to the hospital.

## 2021-10-17 ENCOUNTER — Other Ambulatory Visit
Admission: RE | Admit: 2021-10-17 | Discharge: 2021-10-17 | Disposition: A | Discharge status: Home / Self Care | Payer: 59 | Source: Ambulatory Visit | Attending: General Surgery | Admitting: General Surgery

## 2021-10-17 ENCOUNTER — Other Ambulatory Visit: Payer: Self-pay

## 2021-10-17 DIAGNOSIS — I10 Essential (primary) hypertension: Secondary | ICD-10-CM | POA: Diagnosis not present

## 2021-10-17 DIAGNOSIS — Z01810 Encounter for preprocedural cardiovascular examination: Secondary | ICD-10-CM | POA: Diagnosis present

## 2021-10-17 MED ORDER — FAMOTIDINE 20 MG PO TABS: 20.0000 mg | ORAL_TABLET | Freq: Once | ORAL | Status: DC

## 2021-10-19 ENCOUNTER — Encounter: Payer: Self-pay | Admitting: General Surgery

## 2021-10-19 ENCOUNTER — Other Ambulatory Visit: Payer: Self-pay

## 2021-10-19 ENCOUNTER — Ambulatory Visit
Admission: RE | Admit: 2021-10-19 | Discharge: 2021-10-19 | Disposition: A | Discharge status: Home / Self Care | Payer: 59 | Attending: General Surgery | Admitting: General Surgery

## 2021-10-19 ENCOUNTER — Ambulatory Visit: Payer: 59 | Admitting: Registered Nurse

## 2021-10-19 ENCOUNTER — Encounter
Admission: RE | Disposition: A | Discharge status: Home / Self Care | Payer: Self-pay | Source: Home / Self Care | Attending: General Surgery

## 2021-10-19 DIAGNOSIS — K4090 Unilateral inguinal hernia, without obstruction or gangrene, not specified as recurrent: Secondary | ICD-10-CM | POA: Insufficient documentation

## 2021-10-19 HISTORY — PX: XI ROBOTIC ASSISTED INGUINAL HERNIA REPAIR WITH MESH: SHX6706

## 2021-10-19 SURGERY — REPAIR, HERNIA, INGUINAL, ROBOT-ASSISTED, LAPAROSCOPIC, USING MESH
Anesthesia: General | Site: Groin | Laterality: Right

## 2021-10-19 MED ORDER — MIDAZOLAM HCL 2 MG/2ML IJ SOLN
INTRAMUSCULAR | Status: AC
  Filled 2021-10-19: qty 2

## 2021-10-19 MED ORDER — CEFAZOLIN SODIUM-DEXTROSE 2-4 GM/100ML-% IV SOLN
INTRAVENOUS | Status: AC
  Filled 2021-10-19: qty 100

## 2021-10-19 MED ORDER — LIDOCAINE HCL (CARDIAC) PF 100 MG/5ML IV SOSY
PREFILLED_SYRINGE | INTRAVENOUS | Status: DC | PRN
  Administered 2021-10-19: 80 mg via INTRAVENOUS

## 2021-10-19 MED ORDER — BUPIVACAINE-EPINEPHRINE (PF) 0.25% -1:200000 IJ SOLN
INTRAMUSCULAR | Status: AC
  Filled 2021-10-19: qty 30

## 2021-10-19 MED ORDER — FENTANYL CITRATE (PF) 100 MCG/2ML IJ SOLN
INTRAMUSCULAR | Status: AC
  Filled 2021-10-19: qty 2

## 2021-10-19 MED ORDER — CEFAZOLIN SODIUM-DEXTROSE 2-4 GM/100ML-% IV SOLN
2.0000 g | INTRAVENOUS | Status: AC
  Administered 2021-10-19: 12:00:00 2 g via INTRAVENOUS

## 2021-10-19 MED ORDER — GLYCOPYRROLATE 0.2 MG/ML IJ SOLN
INTRAMUSCULAR | Status: DC | PRN
  Administered 2021-10-19: .2 mg via INTRAVENOUS

## 2021-10-19 MED ORDER — PHENYLEPHRINE HCL (PRESSORS) 10 MG/ML IV SOLN
INTRAVENOUS | Status: DC | PRN
  Administered 2021-10-19 (×2): 80 ug via INTRAVENOUS

## 2021-10-19 MED ORDER — DEXMEDETOMIDINE HCL IN NACL 200 MCG/50ML IV SOLN
INTRAVENOUS | Status: AC
  Filled 2021-10-19: qty 50

## 2021-10-19 MED ORDER — HYDROCODONE-ACETAMINOPHEN 5-325 MG PO TABS: 1.0000 | ORAL_TABLET | ORAL | 0 refills | Status: AC | PRN

## 2021-10-19 MED ORDER — ONDANSETRON HCL 4 MG/2ML IJ SOLN: 4.0000 mg | Freq: Once | INTRAMUSCULAR | Status: DC | PRN

## 2021-10-19 MED ORDER — ORAL CARE MOUTH RINSE: 15.0000 mL | Freq: Once | OROMUCOSAL | Status: AC

## 2021-10-19 MED ORDER — LACTATED RINGERS IV SOLN: INTRAVENOUS | Status: DC

## 2021-10-19 MED ORDER — LIDOCAINE HCL (PF) 2 % IJ SOLN
INTRAMUSCULAR | Status: AC
  Filled 2021-10-19: qty 5

## 2021-10-19 MED ORDER — EPHEDRINE 5 MG/ML INJ
INTRAVENOUS | Status: AC
  Filled 2021-10-19: qty 5

## 2021-10-19 MED ORDER — ONDANSETRON HCL 4 MG/2ML IJ SOLN
INTRAMUSCULAR | Status: DC | PRN
  Administered 2021-10-19: 4 mg via INTRAVENOUS

## 2021-10-19 MED ORDER — ROCURONIUM BROMIDE 10 MG/ML (PF) SYRINGE
PREFILLED_SYRINGE | INTRAVENOUS | Status: AC
  Filled 2021-10-19: qty 10

## 2021-10-19 MED ORDER — ROCURONIUM BROMIDE 100 MG/10ML IV SOLN
INTRAVENOUS | Status: DC | PRN
  Administered 2021-10-19: 10 mg via INTRAVENOUS
  Administered 2021-10-19: 60 mg via INTRAVENOUS

## 2021-10-19 MED ORDER — PROPOFOL 500 MG/50ML IV EMUL
INTRAVENOUS | Status: AC
  Filled 2021-10-19: qty 50

## 2021-10-19 MED ORDER — ACETAMINOPHEN 10 MG/ML IV SOLN
INTRAVENOUS | Status: AC
  Filled 2021-10-19: qty 100

## 2021-10-19 MED ORDER — FENTANYL CITRATE (PF) 100 MCG/2ML IJ SOLN
INTRAMUSCULAR | Status: AC
  Administered 2021-10-19: 14:00:00 25 ug via INTRAVENOUS
  Filled 2021-10-19: qty 2

## 2021-10-19 MED ORDER — CHLORHEXIDINE GLUCONATE 0.12 % MT SOLN: 15.0000 mL | Freq: Once | OROMUCOSAL | Status: AC

## 2021-10-19 MED ORDER — EPHEDRINE SULFATE 50 MG/ML IJ SOLN
INTRAMUSCULAR | Status: DC | PRN
  Administered 2021-10-19 (×5): 5 mg via INTRAVENOUS

## 2021-10-19 MED ORDER — FENTANYL CITRATE (PF) 100 MCG/2ML IJ SOLN
INTRAMUSCULAR | Status: DC | PRN
  Administered 2021-10-19: 100 ug via INTRAVENOUS

## 2021-10-19 MED ORDER — BUPIVACAINE-EPINEPHRINE 0.25% -1:200000 IJ SOLN
INTRAMUSCULAR | Status: DC | PRN
  Administered 2021-10-19: 30 mL

## 2021-10-19 MED ORDER — EPHEDRINE 5 MG/ML INJ
INTRAVENOUS | Status: AC
  Filled 2021-10-19: qty 10

## 2021-10-19 MED ORDER — GLYCOPYRROLATE 0.2 MG/ML IJ SOLN
INTRAMUSCULAR | Status: AC
  Filled 2021-10-19: qty 1

## 2021-10-19 MED ORDER — PROPOFOL 10 MG/ML IV BOLUS
INTRAVENOUS | Status: AC
  Filled 2021-10-19: qty 20

## 2021-10-19 MED ORDER — PROPOFOL 10 MG/ML IV BOLUS
INTRAVENOUS | Status: DC | PRN
  Administered 2021-10-19: 180 mg via INTRAVENOUS

## 2021-10-19 MED ORDER — DEXAMETHASONE SODIUM PHOSPHATE 10 MG/ML IJ SOLN
INTRAMUSCULAR | Status: DC | PRN
  Administered 2021-10-19: 8 mg via INTRAVENOUS

## 2021-10-19 MED ORDER — CHLORHEXIDINE GLUCONATE 0.12 % MT SOLN
OROMUCOSAL | Status: AC
  Administered 2021-10-19: 11:00:00 15 mL via OROMUCOSAL
  Filled 2021-10-19: qty 15

## 2021-10-19 MED ORDER — FENTANYL CITRATE (PF) 100 MCG/2ML IJ SOLN
25.0000 ug | INTRAMUSCULAR | Status: DC | PRN
  Administered 2021-10-19 (×5): 25 ug via INTRAVENOUS

## 2021-10-19 MED ORDER — SUGAMMADEX SODIUM 200 MG/2ML IV SOLN
INTRAVENOUS | Status: DC | PRN
  Administered 2021-10-19: 200 mg via INTRAVENOUS

## 2021-10-19 MED ORDER — MIDAZOLAM HCL 2 MG/2ML IJ SOLN
INTRAMUSCULAR | Status: DC | PRN
  Administered 2021-10-19: 2 mg via INTRAVENOUS

## 2021-10-19 MED ORDER — KETOROLAC TROMETHAMINE 30 MG/ML IJ SOLN
INTRAMUSCULAR | Status: DC | PRN
  Administered 2021-10-19: 30 mg via INTRAVENOUS

## 2021-10-19 MED ORDER — ACETAMINOPHEN 10 MG/ML IV SOLN
INTRAVENOUS | Status: DC | PRN
  Administered 2021-10-19: 1000 mg via INTRAVENOUS

## 2021-10-19 MED ORDER — KETOROLAC TROMETHAMINE 30 MG/ML IJ SOLN
INTRAMUSCULAR | Status: AC
  Filled 2021-10-19: qty 1

## 2021-10-19 SURGICAL SUPPLY — 49 items
BAG INFUSER PRESSURE 100CC (MISCELLANEOUS) IMPLANT
BLADE SURG SZ11 CARB STEEL (BLADE) ×3 IMPLANT
COVER TIP SHEARS 8 DVNC (MISCELLANEOUS) ×1 IMPLANT
COVER TIP SHEARS 8MM DA VINCI (MISCELLANEOUS) ×2
COVER WAND RF STERILE (DRAPES) ×3 IMPLANT
DERMABOND ADVANCED (GAUZE/BANDAGES/DRESSINGS) ×2
DERMABOND ADVANCED .7 DNX12 (GAUZE/BANDAGES/DRESSINGS) ×1 IMPLANT
DRAPE ARM DVNC X/XI (DISPOSABLE) ×3 IMPLANT
DRAPE COLUMN DVNC XI (DISPOSABLE) ×1 IMPLANT
DRAPE DA VINCI XI ARM (DISPOSABLE) ×6
DRAPE DA VINCI XI COLUMN (DISPOSABLE) ×2
ELECT REM PT RETURN 9FT ADLT (ELECTROSURGICAL) ×3
ELECTRODE REM PT RTRN 9FT ADLT (ELECTROSURGICAL) ×1 IMPLANT
GAUZE 4X4 16PLY ~~LOC~~+RFID DBL (SPONGE) ×3 IMPLANT
GLOVE SURG ENC MOIS LTX SZ6.5 (GLOVE) ×6 IMPLANT
GLOVE SURG UNDER POLY LF SZ6.5 (GLOVE) ×6 IMPLANT
GOWN STRL REUS W/ TWL LRG LVL3 (GOWN DISPOSABLE) ×3 IMPLANT
GOWN STRL REUS W/TWL LRG LVL3 (GOWN DISPOSABLE) ×6
IRRIGATOR SUCT 8 DISP DVNC XI (IRRIGATION / IRRIGATOR) IMPLANT
IRRIGATOR SUCTION 8MM XI DISP (IRRIGATION / IRRIGATOR)
IV CATH ANGIO 12GX3 LT BLUE (NEEDLE) IMPLANT
IV NS 1000ML (IV SOLUTION)
IV NS 1000ML BAXH (IV SOLUTION) IMPLANT
KIT PINK PAD W/HEAD ARE REST (MISCELLANEOUS) ×3
KIT PINK PAD W/HEAD ARM REST (MISCELLANEOUS) ×1 IMPLANT
LABEL OR SOLS (LABEL) IMPLANT
MANIFOLD NEPTUNE II (INSTRUMENTS) ×3 IMPLANT
MESH 3DMAX 5X7 RT XLRG (Mesh General) ×2 IMPLANT
MESH 3DMAX MID 5X7 RT XLRG (Mesh General) IMPLANT
NDL INSUFFLATION 14GA 120MM (NEEDLE) ×1 IMPLANT
NEEDLE HYPO 22GX1.5 SAFETY (NEEDLE) ×3 IMPLANT
NEEDLE INSUFFLATION 14GA 120MM (NEEDLE) ×3 IMPLANT
OBTURATOR OPTICAL STANDARD 8MM (TROCAR) ×2
OBTURATOR OPTICAL STND 8 DVNC (TROCAR) ×1
OBTURATOR OPTICALSTD 8 DVNC (TROCAR) ×1 IMPLANT
PACK LAP CHOLECYSTECTOMY (MISCELLANEOUS) ×3 IMPLANT
SEAL CANN UNIV 5-8 DVNC XI (MISCELLANEOUS) ×3 IMPLANT
SEAL XI 5MM-8MM UNIVERSAL (MISCELLANEOUS) ×6
SET TUBE SMOKE EVAC HIGH FLOW (TUBING) ×3 IMPLANT
SOLUTION ELECTROLUBE (MISCELLANEOUS) ×3 IMPLANT
SUT MNCRL 4-0 (SUTURE) ×2
SUT MNCRL 4-0 27XMFL (SUTURE) ×1
SUT VIC AB 2-0 SH 27 (SUTURE) ×2
SUT VIC AB 2-0 SH 27XBRD (SUTURE) ×1 IMPLANT
SUT VLOC 90 S/L VL9 GS22 (SUTURE) ×3 IMPLANT
SUTURE MNCRL 4-0 27XMF (SUTURE) ×1 IMPLANT
TAPE TRANSPORE STRL 2 31045 (GAUZE/BANDAGES/DRESSINGS) IMPLANT
TRAY FOLEY MTR SLVR 16FR STAT (SET/KITS/TRAYS/PACK) ×3 IMPLANT
WATER STERILE IRR 500ML POUR (IV SOLUTION) ×3 IMPLANT

## 2021-10-19 NOTE — Anesthesia Procedure Notes (Signed)
Procedure Name: Intubation Date/Time: 10/19/2021 12:07 PM Performed by: Karoline Caldwell, CRNA Pre-anesthesia Checklist: Patient identified, Patient being monitored, Timeout performed, Emergency Drugs available and Suction available Patient Re-evaluated:Patient Re-evaluated prior to induction Oxygen Delivery Method: Circle system utilized Preoxygenation: Pre-oxygenation with 100% oxygen Induction Type: IV induction Ventilation: Mask ventilation without difficulty and Oral airway inserted - appropriate to patient size Laryngoscope Size: 4 and McGraph Grade View: Grade I Tube type: Oral Tube size: 7.5 mm Number of attempts: 1 Airway Equipment and Method: Stylet Placement Confirmation: ETT inserted through vocal cords under direct vision, positive ETCO2 and breath sounds checked- equal and bilateral Secured at: 21 cm Tube secured with: Tape Dental Injury: Teeth and Oropharynx as per pre-operative assessment

## 2021-10-19 NOTE — Op Note (Signed)
Preoperative diagnosis: Right inguinal hernia.   Postoperative diagnosis: Right inguinal hernia.  Procedure: Robotic assisted Laparoscopic Transabdominal preperitoneal laparoscopic (TAPP) repair of right inguinal hernia.  Anesthesia: GETA  Surgeon: Dr. Hazle Quant  Wound Classification: Clean  Indications:  Patient is a 55 y.o. adult developed a symptomatic right inguinal hernia. Repair was indicated.  Findings: 1. Right pantaloon Inguinal hernia identified 2. Vas deferens and cord structures identified and preserved 3. Bard 3D MID XL mesh used for repair 4. Adequate hemostasis.          Description of procedure: The patient was taken to the operating room and the correct side of surgery was verified. The patient was placed supine with arms tucked at the sides. After obtaining adequate anesthesia, the patients abdomen was prepped and draped in standard sterile fashion. The patient was placed in the Trendelenburg position. A time-out was completed verifying correct patient, procedure, site, positioning, and implant(s) and/or special equipment prior to beginning this procedure. A Veress needle was placed at the umbilicus and pneumoperitoneum created with insufflation of carbon dioxide to 15 mmHg. After the Veress needle was removed, an 8-mm trocar was placed on epigastric area and the 30 angled laparoscope inserted. Two 8-mm trocars were then placed lateral to the rectus sheath under direct visualization. Both inguinal regions were inspected and the median umbilical ligament, medial umbilical ligament, and lateral umbilical fold were identified.  The robotic arms were docked. The robotic scope was inserted and the pelvic area anatomy targeted.  The peritoneum was incised with scissors along a line 5 cm above the superior edge of the hernia defect, extending from the median umbilical ligament to the anterior superior iliac spine. The peritoneal flap was mobilized inferiorly using blunt  and sharp dissection. The inferior epigastric vessels were exposed and the pubic symphysis was identified. Coopers ligament was dissected to its junction with the iliac vein. The dissection was continued inferiorly to the iliopubic tract, with care taken to avoid injury to the femoral branch of the genitofemoral nerve and the lateral femoral cutaneous nerve. The cord structures were parietalized. The hernia was identified and reduced by gentle traction.  The hernia sac and lipoma was noted mobilized from the cord structures and reduced into the peritoneal cavity.  A large piece of mesh was rolled longitudinally into a compact cylinder and passed through a trocar. The cylinder was placed along the inferior aspect of the working space and unrolled into place to completely cover the direct, indirect, and femoral spaces. The mesh was secured into place superiorly to the anterior abdominal wall and inferiorly and medially to Coopers ligament with absorbable sutures. Care was taken to avoid the inferolateral triangles containing the iliac vessels and genital nerves. The peritoneal flap was closed over the mesh and secured with suture in similar positions of safety. After ensuring adequate hemostasis, the trocars were removed and the pneumoperitoneum allowed to escape. The trocar incisions were closed using monocryl and skin adhesive dressings applied.  The patient tolerated the procedure well and was taken to the postanesthesia care unit in stable condition.   Specimen: None  Complications: None  Estimated Blood Loss: 5 mL

## 2021-10-19 NOTE — Interval H&P Note (Signed)
History and Physical Interval Note:  10/19/2021 11:52 AM  Tony Lang  has presented today for surgery, with the diagnosis of K40.90 non recurrent unilateral inguinal hernia w/o obstruction or gangrene.  The various methods of treatment have been discussed with the patient and family. After consideration of risks, benefits and other options for treatment, the patient has consented to  Procedure(s): XI ROBOTIC ASSISTED INGUINAL HERNIA REPAIR WITH MESH (Right) as a surgical intervention.  The patient's history has been reviewed, patient examined, no change in status, stable for surgery.  I have reviewed the patient's chart and labs.  Questions were answered to the patient's satisfaction.     Carolan Shiver

## 2021-10-19 NOTE — Anesthesia Preprocedure Evaluation (Addendum)
Anesthesia Evaluation  Patient identified by MRN, date of birth, ID band Patient awake    Reviewed: Allergy & Precautions, NPO status , Patient's Chart, lab work & pertinent test results, reviewed documented beta blocker date and time   Airway Mallampati: II  TM Distance: >3 FB Neck ROM: full    Dental  (+) Teeth Intact   Pulmonary neg pulmonary ROS,    Pulmonary exam normal        Cardiovascular Exercise Tolerance: Good hypertension, Pt. on medications negative cardio ROS Normal cardiovascular exam Rhythm:Regular Rate:Normal     Neuro/Psych TIAnegative neurological ROS  negative psych ROS   GI/Hepatic negative GI ROS, Neg liver ROS,   Endo/Other  negative endocrine ROS  Renal/GU negative Renal ROS  negative genitourinary   Musculoskeletal negative musculoskeletal ROS (+)   Abdominal Normal abdominal exam  (+)   Peds negative pediatric ROS (+)  Hematology negative hematology ROS (+)   Anesthesia Other Findings Past Medical History: No date: Enlarged prostate No date: Hypertension No date: TIA (transient ischemic attack) 2011: Vasovagal episode  Past Surgical History: No date: BONE TUMOR EXCISION; Right     Comment:  TIBIA No date: FRACTURE SURGERY; Left     Comment:  ARM HAS PLATE SCREWS  BMI    Body Mass Index: 23.66 kg/m      Reproductive/Obstetrics negative OB ROS                            Anesthesia Physical Anesthesia Plan  ASA: 2  Anesthesia Plan: General   Post-op Pain Management:    Induction: Intravenous  PONV Risk Score and Plan: Ondansetron, Dexamethasone, Midazolam and Treatment may vary due to age or medical condition  Airway Management Planned: Oral ETT  Additional Equipment:   Intra-op Plan:   Post-operative Plan: Extubation in OR  Informed Consent: I have reviewed the patients History and Physical, chart, labs and discussed the procedure  including the risks, benefits and alternatives for the proposed anesthesia with the patient or authorized representative who has indicated his/her understanding and acceptance.     Dental Advisory Given  Plan Discussed with: CRNA and Surgeon  Anesthesia Plan Comments:         Anesthesia Quick Evaluation

## 2021-10-19 NOTE — Discharge Instructions (Addendum)
  Diet: Resume home heart healthy regular diet.   Activity: No heavy lifting >20 pounds (children, pets, laundry, garbage) or strenuous activity until follow-up, but light activity and walking are encouraged. Do not drive or drink alcohol if taking narcotic pain medications.  Wound care: May shower with soapy water and pat dry (do not rub incisions), but no baths or submerging incision underwater until follow-up. (no swimming)   Medications: Resume all home medications. For mild to moderate pain: acetaminophen (Tylenol) or ibuprofen (if no kidney disease). Combining Tylenol with alcohol can substantially increase your risk of causing liver disease. Narcotic pain medications, if prescribed, can be used for severe pain, though may cause nausea, constipation, and drowsiness. Do not combine Tylenol and Norco within a 6 hour period as Norco contains Tylenol. If you do not need the narcotic pain medication, you do not need to fill the prescription.  Call office ([phone]) at any time if any questions, worsening pain, fevers/chills, bleeding, drainage from incision site, or other concerns.   AMBULATORY SURGERY  DISCHARGE INSTRUCTIONS   The drugs that you were given will stay in your system until tomorrow so for the next 24 hours you should not:  Drive an automobile Make any legal decisions Drink any alcoholic beverage   You may resume regular meals tomorrow.  Today it is better to start with liquids and gradually work up to solid foods.  You may eat anything you prefer, but it is better to start with liquids, then soup and crackers, and gradually work up to solid foods.   Please notify your doctor immediately if you have any unusual bleeding, trouble breathing, redness and pain at the surgery site, drainage, fever, or pain not relieved by medication.      Please contact your physician with any problems or Same Day Surgery at [phone], Monday through Friday 6 am to 4 pm, or [facility] [place] [facility] number at [phone].  

## 2021-10-19 NOTE — Transfer of Care (Signed)
Immediate Anesthesia Transfer of Care Note  Patient: Tony Lang  Procedure(s) Performed: XI ROBOTIC ASSISTED INGUINAL HERNIA REPAIR WITH MESH (Right: Groin)  Patient Location: PACU  Anesthesia Type:General  Level of Consciousness: awake, alert  and oriented  Airway & Oxygen Therapy: Patient Spontanous Breathing  Post-op Assessment: Report given to RN and Post -op Vital signs reviewed and stable  Post vital signs: Reviewed and stable  Last Vitals:  Vitals Value Taken Time  BP 141/66 10/19/21 1344  Temp 36.6 C 10/19/21 1344  Pulse 95 10/19/21 1344  Resp 25 10/19/21 1344  SpO2 100 % 10/19/21 1344  Vitals shown include unvalidated device data.  Last Pain:  Vitals:   10/19/21 1344  TempSrc:   PainSc: 0-No pain         Complications: No notable events documented.

## 2021-10-20 ENCOUNTER — Encounter: Payer: Self-pay | Admitting: General Surgery

## 2021-10-20 NOTE — Anesthesia Postprocedure Evaluation (Signed)
Anesthesia Post Note  Patient: Tony Lang  Procedure(s) Performed: XI ROBOTIC ASSISTED INGUINAL HERNIA REPAIR WITH MESH (Right: Groin)  Anesthesia Type: General Anesthetic complications: no   No notable events documented.   Last Vitals:  Vitals:   10/19/21 1435 10/19/21 1445  BP:  (!) 144/85  Pulse: 73 78  Resp: 18 18  Temp:  37 C  SpO2: 97% 99%    Last Pain:  Vitals:   10/19/21 1445  TempSrc: Tympanic  PainSc: 2                  Manfred Arch

## 2021-11-11 ENCOUNTER — Other Ambulatory Visit: Payer: Self-pay

## 2021-12-26 DIAGNOSIS — E559 Vitamin D deficiency, unspecified: Secondary | ICD-10-CM | POA: Diagnosis not present

## 2021-12-26 DIAGNOSIS — Z125 Encounter for screening for malignant neoplasm of prostate: Secondary | ICD-10-CM | POA: Diagnosis not present

## 2021-12-26 DIAGNOSIS — I10 Essential (primary) hypertension: Secondary | ICD-10-CM | POA: Diagnosis not present

## 2021-12-26 DIAGNOSIS — K4090 Unilateral inguinal hernia, without obstruction or gangrene, not specified as recurrent: Secondary | ICD-10-CM | POA: Diagnosis not present

## 2021-12-26 DIAGNOSIS — D649 Anemia, unspecified: Secondary | ICD-10-CM | POA: Diagnosis not present

## 2022-01-02 DIAGNOSIS — Z0000 Encounter for general adult medical examination without abnormal findings: Secondary | ICD-10-CM | POA: Diagnosis not present

## 2022-01-02 DIAGNOSIS — M2550 Pain in unspecified joint: Secondary | ICD-10-CM | POA: Diagnosis not present

## 2022-01-02 DIAGNOSIS — I10 Essential (primary) hypertension: Secondary | ICD-10-CM | POA: Diagnosis not present

## 2023-08-06 ENCOUNTER — Ambulatory Visit: Payer: Self-pay | Admitting: Podiatry

## 2023-08-09 ENCOUNTER — Ambulatory Visit (INDEPENDENT_AMBULATORY_CARE_PROVIDER_SITE_OTHER): Payer: BC Managed Care – PPO | Admitting: Podiatry

## 2023-08-09 ENCOUNTER — Encounter: Payer: Self-pay | Admitting: Podiatry

## 2023-08-09 VITALS — BP 153/76 | HR 74

## 2023-08-09 DIAGNOSIS — M722 Plantar fascial fibromatosis: Secondary | ICD-10-CM

## 2023-08-09 DIAGNOSIS — Q667 Congenital pes cavus, unspecified foot: Secondary | ICD-10-CM

## 2023-08-09 NOTE — Progress Notes (Signed)
Subjective:  Patient ID: Tony Lang, adult    DOB: 1966-05-18,  MRN: [id number]  Chief Complaint  Patient presents with   Foot Pain    "Right up in my high arch, I have pain on the left foot.  I went to the [employer] and got some inserts but I can't tell any difference."    57 y.o. adult presents with the above complaint.  Patient presents with left midfoot plantar fasciitis pain.  Patient states painful to touch is progressive gotten worse.  He been to the [facility] dressing inserts but cannot tell a difference.  He wanted get it evaluated pain scale 7 out of 10 dull aching nature hurts with ambulation worse with pressure he has not seen and was prior to seeing me.   Review of Systems: Negative except as noted in the HPI. Denies N/V/F/Ch.  Past Medical History:  Diagnosis Date   Enlarged prostate    Hypertension    TIA (transient ischemic attack)    Vasovagal episode 2011    Current Outpatient Medications:    acetaminophen (TYLENOL) 500 MG tablet, Take 500 mg by mouth every 6 (six) hours as needed for headache, moderate pain or mild pain., Disp: , Rfl:    ferrous sulfate 325 (65 FE) MG tablet, Take 325 mg by mouth daily with breakfast., Disp: , Rfl:    ibuprofen (ADVIL) 400 MG tablet, Take 400 mg by mouth daily as needed for mild pain or moderate pain., Disp: , Rfl:    lisinopril (PRINIVIL,ZESTRIL) 20 MG tablet, Take 1 tablet (20 mg total) by mouth daily., Disp: , Rfl:    tamsulosin (FLOMAX) 0.4 MG CAPS capsule, Take 0.4 mg by mouth daily., Disp: , Rfl:    traMADol (ULTRAM) 50 MG tablet, Take 25 mg by mouth daily., Disp: , Rfl:   Social History   Tobacco Use  Smoking Status Never  Smokeless Tobacco Never    No Known Allergies Objective:   Vitals:   08/09/23 1322  BP: (!) 153/76  Pulse: 74   There is no height or weight on file to calculate BMI. Constitutional Well developed. Well nourished.  Vascular Dorsalis pedis pulses palpable  bilaterally. Posterior tibial pulses palpable bilaterally. Capillary refill normal to all digits.  No cyanosis or clubbing noted. Pedal hair growth normal.  Neurologic Normal speech. Oriented to person, place, and time. Epicritic sensation to light touch grossly present bilaterally.  Dermatologic Nails well groomed and normal in appearance. No open wounds. No skin lesions.  Orthopedic: Normal joint ROM without pain or crepitus bilaterally. No visible deformities. Tender to palpation at the midfoot plantar fasciitis.  No pain at the calcaneal tuber No pain with calcaneal squeeze left. Ankle ROM diminished range of motion left. Silfverskiold Test: positive left.   Radiographs: None  Assessment:   1. Plantar fasciitis of left foot   2. Pes cavus    Plan:  Patient was evaluated and treated and all questions answered.  Plantar Fasciitis, left at midfoot - XR reviewed as above.  - Educated on icing and stretching. Instructions given.  - Injection delivered to the plantar fascia as below. - DME: Plantar fascial brace dispensed to support the medial longitudinal arch of the foot and offload pressure from the heel and prevent arch collapse during weightbearing - Pharmacologic management: None  Pes cavus -I explained to patient the etiology of pes cavus and relationship with Planter fasciitis and various treatment options were discussed.  Given patient foot structure in  the setting of Planter fasciitis I believe patient will benefit from custom-made orthotics to help control the hindfoot motion support the arch of the foot and take the stress away from plantar fascial.  Patient agrees with the plan like to proceed with orthotics -Patient was casted for orthotics   Procedure: Injection Tendon/Ligament Location: Left plantar fascia at t at the midfoot.  Plantar approach  Skin Prep: alcohol Injectate: 0.5 cc 0.5% marcaine plain, 0.5 cc of 1% Lidocaine, 0.5 cc kenalog 10. Disposition:  Patient tolerated procedure well. Injection site dressed with a band-aid.  No follow-ups on file.

## 2023-08-13 NOTE — Progress Notes (Signed)
Orthotic order placed  Will call when in will need fitting appt -TG

## 2023-09-06 ENCOUNTER — Ambulatory Visit (INDEPENDENT_AMBULATORY_CARE_PROVIDER_SITE_OTHER): Payer: BC Managed Care – PPO | Admitting: Podiatry

## 2023-09-06 ENCOUNTER — Encounter: Payer: Self-pay | Admitting: Podiatry

## 2023-09-06 DIAGNOSIS — M722 Plantar fascial fibromatosis: Secondary | ICD-10-CM

## 2023-09-06 DIAGNOSIS — Q667 Congenital pes cavus, unspecified foot: Secondary | ICD-10-CM | POA: Diagnosis not present

## 2023-09-06 NOTE — Progress Notes (Signed)
Subjective:  Patient ID: Tony Lang, adult    DOB: Sep 10, 1966,  MRN: [id number]  Chief Complaint  Patient presents with   Plantar Fasciitis    "It's fine."    57 y.o. adult presents with the above complaint.  Patient was with left plantar fasciitis.  Patient states that he is doing better the injection helped.  He does not have much pain he has still some residual pain.  He he would like to do another shot denies any other acute complaints   Review of Systems: Negative except as noted in the HPI. Denies N/V/F/Ch.  Past Medical History:  Diagnosis Date   Enlarged prostate    Hypertension    TIA (transient ischemic attack)    Vasovagal episode 2011    Current Outpatient Medications:    acetaminophen (TYLENOL) 500 MG tablet, Take 500 mg by mouth every 6 (six) hours as needed for headache, moderate pain or mild pain., Disp: , Rfl:    ferrous sulfate 325 (65 FE) MG tablet, Take 325 mg by mouth daily with breakfast., Disp: , Rfl:    ibuprofen (ADVIL) 400 MG tablet, Take 400 mg by mouth daily as needed for mild pain or moderate pain., Disp: , Rfl:    lisinopril (PRINIVIL,ZESTRIL) 20 MG tablet, Take 1 tablet (20 mg total) by mouth daily., Disp: , Rfl:    tamsulosin (FLOMAX) 0.4 MG CAPS capsule, Take 0.4 mg by mouth daily., Disp: , Rfl:    traMADol (ULTRAM) 50 MG tablet, Take 25 mg by mouth daily., Disp: , Rfl:   Social History   Tobacco Use  Smoking Status Never  Smokeless Tobacco Never    No Known Allergies Objective:   There were no vitals filed for this visit.  There is no height or weight on file to calculate BMI. Constitutional Well developed. Well nourished.  Vascular Dorsalis pedis pulses palpable bilaterally. Posterior tibial pulses palpable bilaterally. Capillary refill normal to all digits.  No cyanosis or clubbing noted. Pedal hair growth normal.  Neurologic Normal speech. Oriented to person, place, and time. Epicritic sensation to light touch grossly  present bilaterally.  Dermatologic Nails well groomed and normal in appearance. No open wounds. No skin lesions.  Orthopedic: Normal joint ROM without pain or crepitus bilaterally. No visible deformities. Tender to palpation at the midfoot plantar fasciitis.  No pain at the calcaneal tuber No pain with calcaneal squeeze left. Ankle ROM diminished range of motion left. Silfverskiold Test: positive left.   Radiographs: None  Assessment:   No diagnosis found.  Plan:  Patient was evaluated and treated and all questions answered.  Plantar Fasciitis, left at midfoot - XR reviewed as above.  - Educated on icing and stretching. Instructions given.  -Second injection delivered to the plantar fascia as below. - DME: Plantar fascial brace dispensed to support the medial longitudinal arch of the foot and offload pressure from the heel and prevent arch collapse during weightbearing - Pharmacologic management: None  Pes cavus -I explained to patient the etiology of pes cavus and relationship with Planter fasciitis and various treatment options were discussed.  Given patient foot structure in the setting of Planter fasciitis I believe patient will benefit from custom-made orthotics to help control the hindfoot motion support the arch of the foot and take the stress away from plantar fascial.  Patient agrees with the plan like to proceed with orthotics -Patient is awaiting orthotics   Procedure: Injection Tendon/Ligament Location: Left plantar fascia at t at the midfoot.  Plantar  approach  Skin Prep: alcohol Injectate: 0.5 cc 0.5% marcaine plain, 0.5 cc of 1% Lidocaine, 0.5 cc kenalog 10. Disposition: Patient tolerated procedure well. Injection site dressed with a band-aid.  No follow-ups on file.

## 2023-09-14 ENCOUNTER — Ambulatory Visit: Payer: BC Managed Care – PPO

## 2023-09-14 NOTE — Progress Notes (Signed)
Patient presents today to pick up custom molded foot orthotics, diagnosed with Plantar Fasciitis by Dr. Allena Katz .   Orthotics were dispensed and fit was satisfactory. Reviewed instructions for break-in and wear. Written instructions given to patient.  Patient will follow up as needed.   Addison Bailey CPed, CFo, CFm

## 2023-10-15 ENCOUNTER — Telehealth: Payer: Self-pay | Admitting: Podiatry

## 2023-10-15 NOTE — Telephone Encounter (Signed)
Patient called he saw you and had orthotics and a shot when he was here last. Now he is having pain on the top of that foot. What can he do? [phone].

## 2023-10-16 MED ORDER — METHYLPREDNISOLONE 4 MG PO TBPK: ORAL_TABLET | ORAL | 0 refills | Status: AC

## 2023-10-16 NOTE — Addendum Note (Signed)
Addended by: Nicholes Rough on: 10/16/2023 08:41 AM   Modules accepted: Orders

## 2023-10-18 ENCOUNTER — Telehealth: Payer: Self-pay | Admitting: Podiatry

## 2023-10-18 ENCOUNTER — Other Ambulatory Visit: Payer: Self-pay | Admitting: Podiatry

## 2023-10-18 MED ORDER — OXYCODONE-ACETAMINOPHEN 5-325 MG PO TABS: 1.0000 | ORAL_TABLET | ORAL | 0 refills | Status: AC | PRN

## 2023-10-18 NOTE — Telephone Encounter (Signed)
Patient states the steroid that was called in this week is not giving him any relief.  Is therej something else he can take?

## 2023-10-25 ENCOUNTER — Telehealth: Payer: Self-pay

## 2023-10-25 NOTE — Telephone Encounter (Addendum)
Patient called stating he is now having pain in the upper part of his left foot instead of the lower part. He has been seen over the past few months for lower left foot pain.

## 2023-10-26 NOTE — Telephone Encounter (Signed)
Returned call to patient, but no answer. Message was left for patient to call and schedule an appointment in 1-2 weeks.

## 2024-05-19 ENCOUNTER — Ambulatory Visit (INDEPENDENT_AMBULATORY_CARE_PROVIDER_SITE_OTHER): Payer: Self-pay | Admitting: Urology

## 2024-05-19 ENCOUNTER — Encounter: Payer: Self-pay | Admitting: Urology

## 2024-05-19 VITALS — BP 168/112 | HR 80 | Ht 70.0 in | Wt 175.0 lb

## 2024-05-19 DIAGNOSIS — N401 Enlarged prostate with lower urinary tract symptoms: Secondary | ICD-10-CM

## 2024-05-19 DIAGNOSIS — R3912 Poor urinary stream: Secondary | ICD-10-CM | POA: Diagnosis not present

## 2024-05-19 DIAGNOSIS — N400 Enlarged prostate without lower urinary tract symptoms: Secondary | ICD-10-CM

## 2024-05-19 LAB — MICROSCOPIC EXAMINATION

## 2024-05-19 LAB — BLADDER SCAN AMB NON-IMAGING

## 2024-05-19 LAB — URINALYSIS, COMPLETE
Bilirubin, UA: NEGATIVE
Glucose, UA: NEGATIVE
Ketones, UA: NEGATIVE
Leukocytes,UA: NEGATIVE
Nitrite, UA: NEGATIVE
RBC, UA: NEGATIVE
Specific Gravity, UA: 1.03 (ref 1.005–1.030)
Urobilinogen, Ur: 0.2 mg/dL (ref 0.2–1.0)
pH, UA: 5.5 (ref 5.0–7.5)

## 2024-05-19 MED ORDER — FINASTERIDE 5 MG PO TABS
5.0000 mg | ORAL_TABLET | Freq: Every day | ORAL | 13 refills | Status: AC
Start: 2024-05-19 — End: ?

## 2024-05-19 NOTE — Progress Notes (Signed)
 I, Tony Lang, acting as a scribe for Tony JAYSON Barba, MD., have documented all relevant documentation on the behalf of Tony JAYSON Barba, MD, as directed by Tony JAYSON Barba, MD while in the presence of Tony JAYSON Barba, MD.  05/19/2024 2:44 PM   Tony Lang 14-Jul-1966 [id number]  Referring provider: Sadie Manna, MD [address] [facility] [place],  [place] [address]  Chief Complaint  Patient presents with   Benign Prostatic Hypertrophy    HPI: Tony Lang is a 58 y.o. male referred for evaluation of lower urinary tract symptoms.   10-year history of bothersome lower urinary tract symptoms. Initially started on tamsulosin , which was titrated to 0.8 mg approximately 2 years ago. Most bothersome symptoms are urinary hesitancy, weak urinary stream and sensation of incomplete emptying; IPSS today 12/35.  Tamsulosin  has definitely improved his initial symptoms.  He is more interested in preventative treatment. Denies dysuria, gross hematuria No flank, abdominal, or pelvic pain    PMH: Past Medical History:  Diagnosis Date   Enlarged prostate    Hypertension    TIA (transient ischemic attack)    Vasovagal episode 2011    Surgical History: Past Surgical History:  Procedure Laterality Date   BONE TUMOR EXCISION Right    TIBIA   FRACTURE SURGERY Left    ARM HAS PLATE SCREWS   XI ROBOTIC ASSISTED INGUINAL HERNIA REPAIR WITH MESH Right 10/19/2021   Procedure: XI ROBOTIC ASSISTED INGUINAL HERNIA REPAIR WITH MESH;  Surgeon: Rodolph Romano, MD;  Location: [facility] ORS;  Service: General;  Laterality: Right;    Home Medications:  Allergies as of 05/19/2024   No Known Allergies      Medication List        Accurate as of May 19, 2024  2:44 PM. If you have any questions, ask your nurse or doctor.          acetaminophen  500 MG tablet Commonly known as: TYLENOL  Take 500 mg by mouth every 6 (six) hours as needed for headache,  moderate pain or mild pain.   ferrous sulfate 325 (65 FE) MG tablet Take 325 mg by mouth daily with breakfast.   finasteride  5 MG tablet Commonly known as: PROSCAR  Take 1 tablet (5 mg total) by mouth daily. Started by: Tony Lang   ibuprofen 400 MG tablet Commonly known as: ADVIL Take 400 mg by mouth daily as needed for mild pain or moderate pain.   lisinopril  20 MG tablet Commonly known as: ZESTRIL  Take 1 tablet (20 mg total) by mouth daily.   methylPREDNISolone  4 MG Tbpk tablet Commonly known as: MEDROL  DOSEPAK Take as directed   oxyCODONE -acetaminophen  5-325 MG tablet Commonly known as: Percocet Take 1 tablet by mouth every 4 (four) hours as needed for severe pain (pain score 7-10).   tamsulosin  0.4 MG Caps capsule Commonly known as: FLOMAX  Take 0.4 mg by mouth daily.   traMADol 50 MG tablet Commonly known as: ULTRAM Take 25 mg by mouth daily.        Allergies: No Known Allergies  Social History:  reports that she has never smoked. She has never used smokeless tobacco. She reports current alcohol use of about 35.0 standard drinks of alcohol per week. She reports that she does not use drugs.   Physical Exam: BP (!) 168/112   Pulse 80   Ht 5' 10 (1.778 m)   Wt 175 lb (79.4 kg)   BMI 25.11 kg/m   Constitutional:  Alert and oriented, No acute distress. HEENT: [place] AT, moist mucus membranes.  Trachea midline, no masses. Cardiovascular: No clubbing, cyanosis, or edema. Respiratory: Normal respiratory effort, no increased work of breathing. GI: Abdomen is soft, nontender, nondistended, no abdominal masses GU: Prostate 50 grams flat, smooth without nodules.  Skin: No rashes, bruises or suspicious lesions. Neurologic: Grossly intact, no focal deficits, moving all 4 extremities. Psychiatric: Normal mood and affect.   Assessment & Plan:    1. BPH with LUTS Moderate LUTS on tamsulosin . PVR today is 0 mL. Discussed adding a 5-alpha reductase inhibitor  (5-ARI) to reduce prostate size by 30% and potentially prevent further growth. Surgical options discussed; however, patient does not desire surgical management at this time. Patient interested in starting a 5-ARI medication. Discussed potential side effects, including erectile dysfunction, decreased semen volume, and finasteride  syndrome. Follow up in six months for symptom recheck and IPSS. If symptoms have improved, consider decreasing tamsulosin  to 0.4 mg at that visit.  I have reviewed the above documentation for accuracy and completeness, and I agree with the above.   Tony JAYSON Barba, MD  [facility] [address], [address] [place], [place] [address] [phone]

## 2024-11-12 ENCOUNTER — Ambulatory Visit: Payer: Self-pay | Admitting: Urology

## 2024-11-19 ENCOUNTER — Ambulatory Visit: Admitting: Urology
